# Patient Record
Sex: Male | Born: 1955 | Race: White | Hispanic: No | Marital: Married | Smoking: Never smoker
Health system: Southern US, Community
[De-identification: ages and names within clinical notes are randomized; demographics above are authoritative.]

## PROBLEM LIST (undated history)

## (undated) DIAGNOSIS — T7840XA Allergy, unspecified, initial encounter: Secondary | ICD-10-CM

## (undated) DIAGNOSIS — I4891 Unspecified atrial fibrillation: Secondary | ICD-10-CM

## (undated) DIAGNOSIS — G4730 Sleep apnea, unspecified: Secondary | ICD-10-CM

## (undated) DIAGNOSIS — J309 Allergic rhinitis, unspecified: Secondary | ICD-10-CM

## (undated) DIAGNOSIS — R3915 Urgency of urination: Secondary | ICD-10-CM

## (undated) DIAGNOSIS — I10 Essential (primary) hypertension: Secondary | ICD-10-CM

## (undated) DIAGNOSIS — R319 Hematuria, unspecified: Secondary | ICD-10-CM

## (undated) DIAGNOSIS — N3281 Overactive bladder: Secondary | ICD-10-CM

## (undated) DIAGNOSIS — R7989 Other specified abnormal findings of blood chemistry: Secondary | ICD-10-CM

## (undated) DIAGNOSIS — N400 Enlarged prostate without lower urinary tract symptoms: Secondary | ICD-10-CM

## (undated) HISTORY — DX: Hematuria, unspecified: R31.9

## (undated) HISTORY — DX: Allergy, unspecified, initial encounter: T78.40XA

## (undated) HISTORY — DX: Benign prostatic hyperplasia without lower urinary tract symptoms: N40.0

## (undated) HISTORY — DX: Essential (primary) hypertension: I10

## (undated) HISTORY — DX: Morbid (severe) obesity due to excess calories: E66.01

## (undated) HISTORY — DX: Sleep apnea, unspecified: G47.30

---

## 2010-02-18 ENCOUNTER — Ambulatory Visit: Payer: Self-pay | Admitting: Family Medicine

## 2011-01-15 IMAGING — US US EXTREM LOW VENOUS*R*
1 series · 17 of 22 positions shown · non-contrast
Comparison: none

REASON FOR EXAM: STAT CR [id number]  RT leg pain  eval DVT
COMMENTS:

[Series 1: us extrem low venous*right* · 17 of 22 slices shown]
[im 1/22]
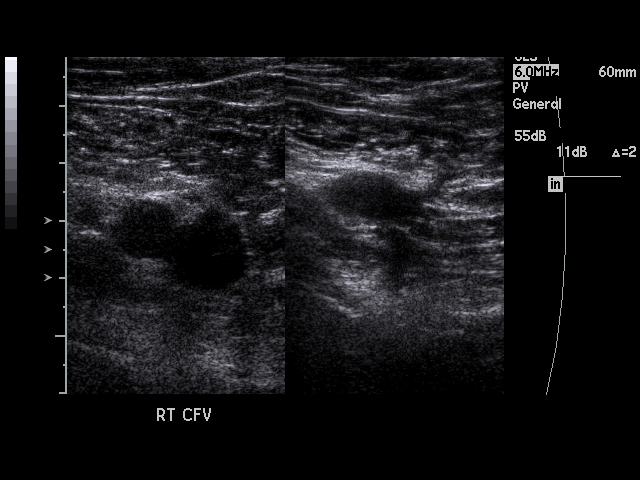
[im 2/22]
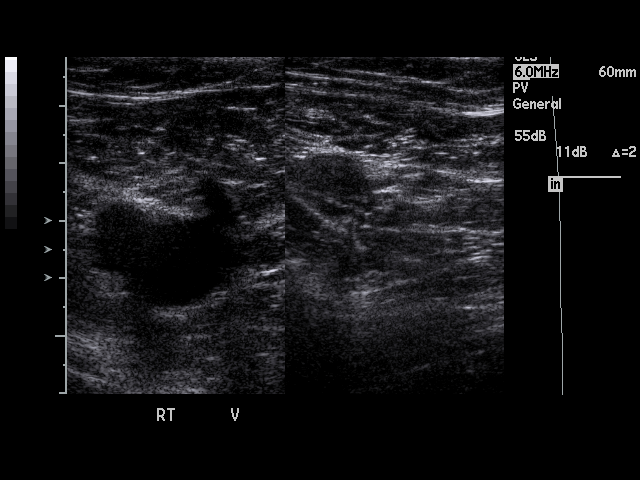
[im 4/22]
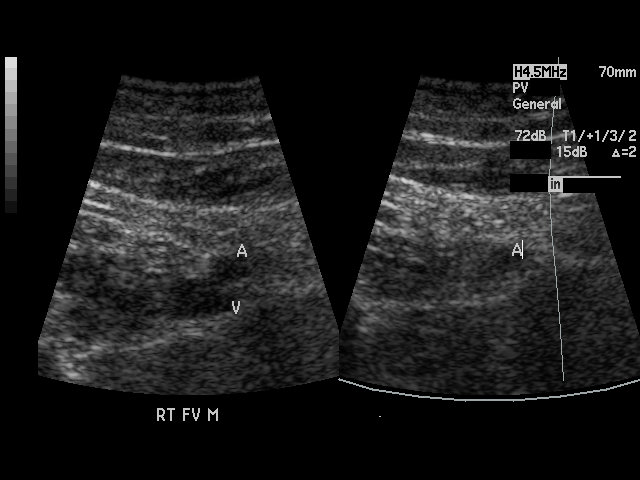
[im 5/22]
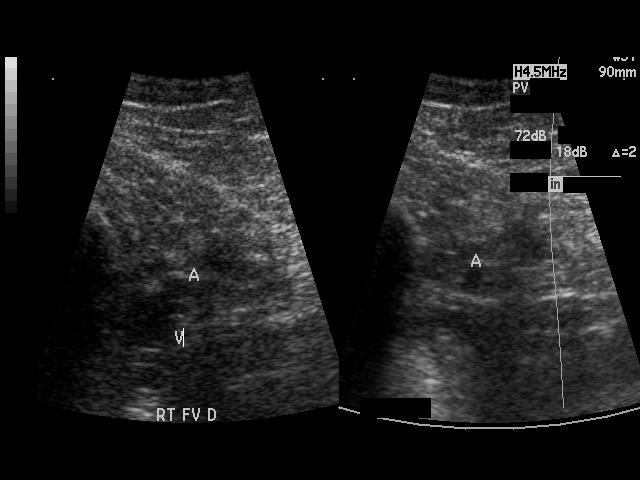
[im 6/22]
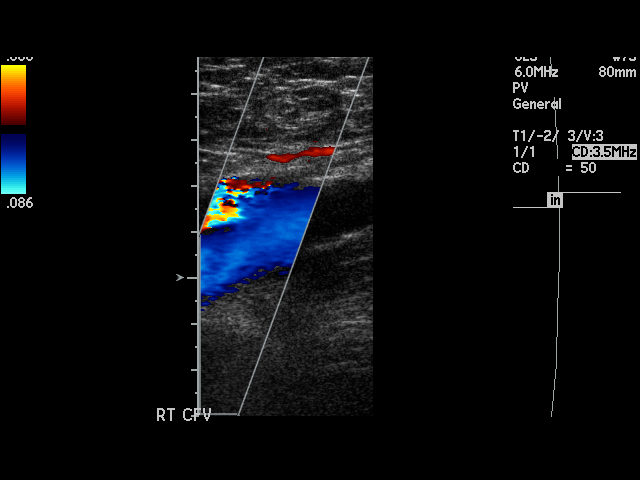
[im 8/22]
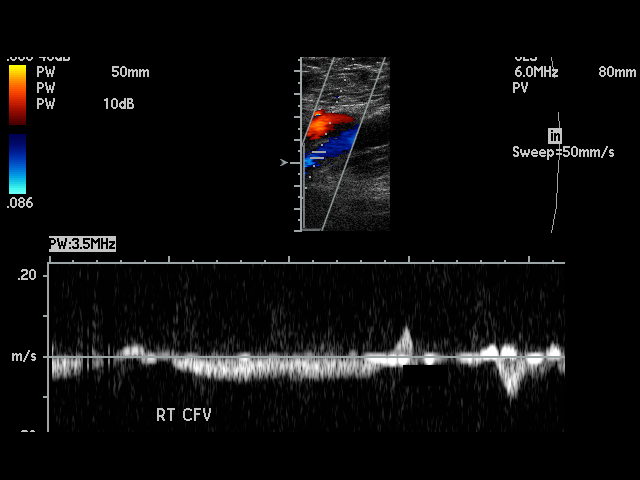
[im 9/22]
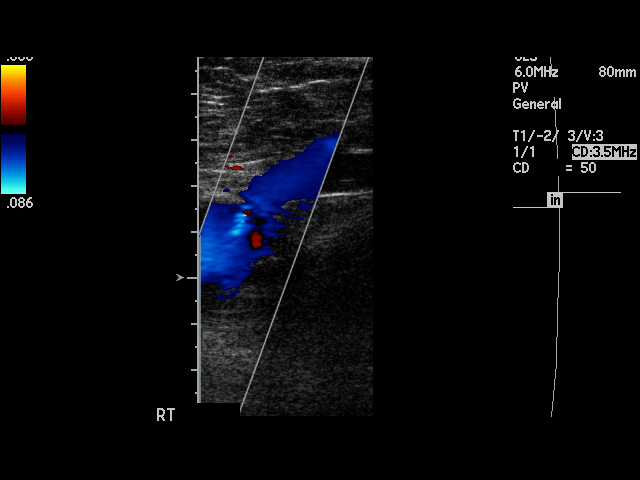
[im 10/22]
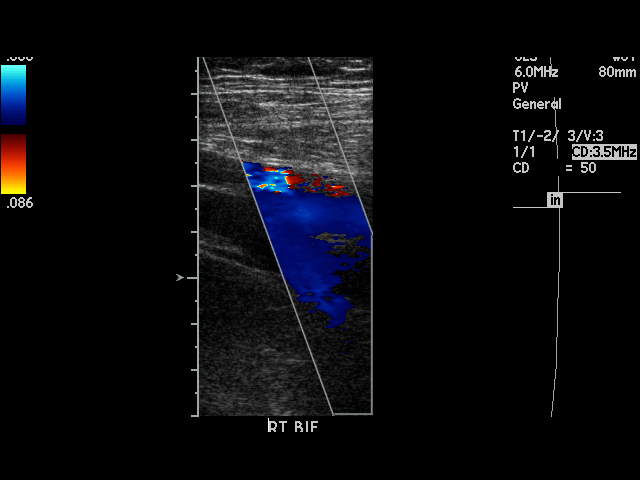
[im 12/22]
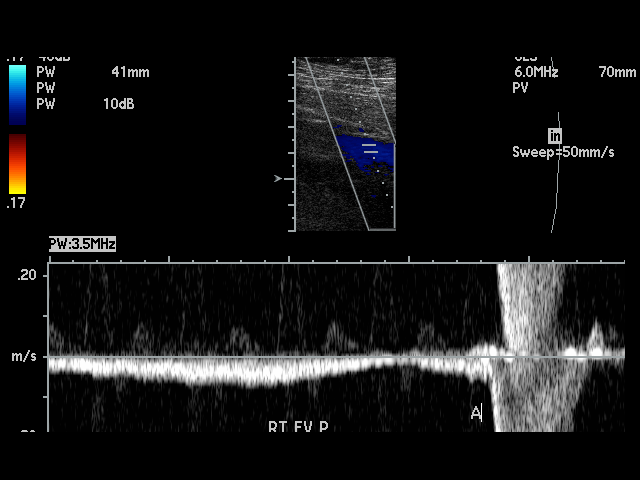
[im 13/22]
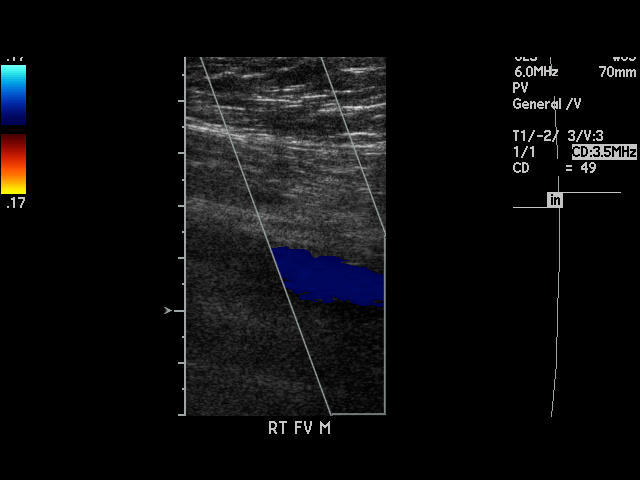
[im 14/22]
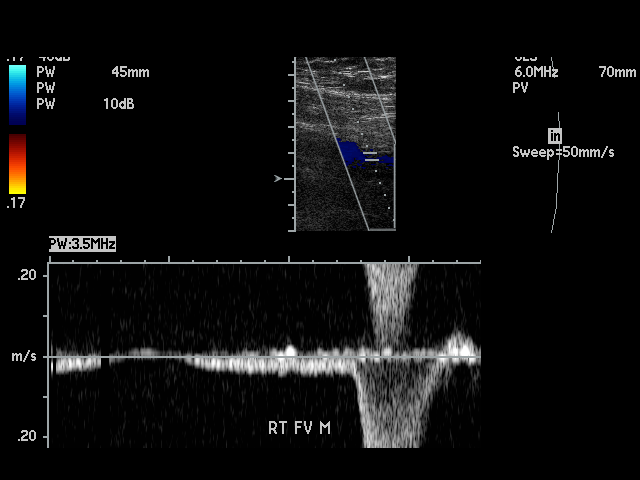
[im 15/22]
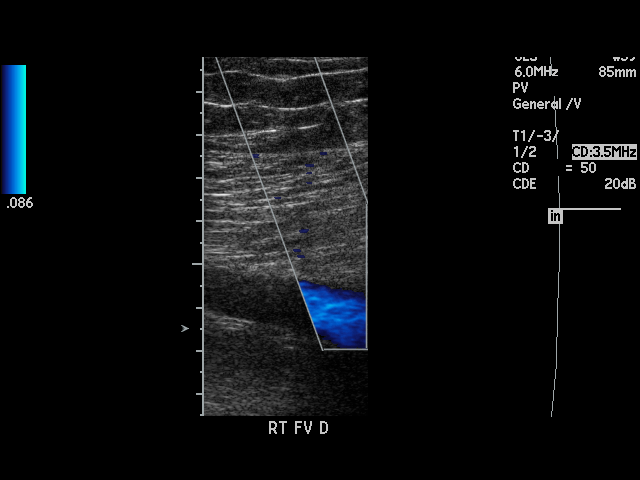
[im 17/22]
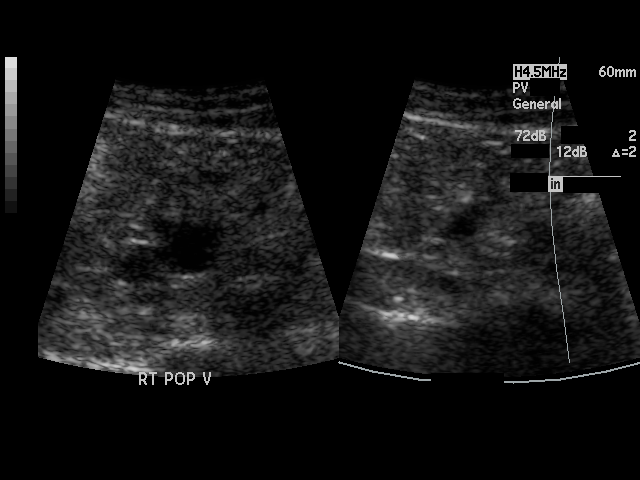
[im 18/22]
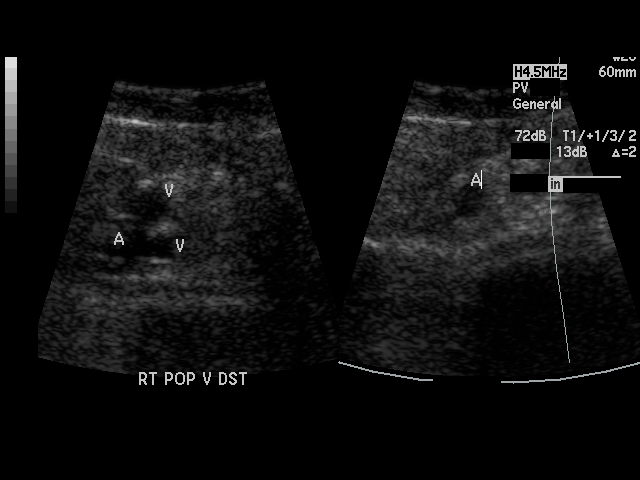
[im 19/22]
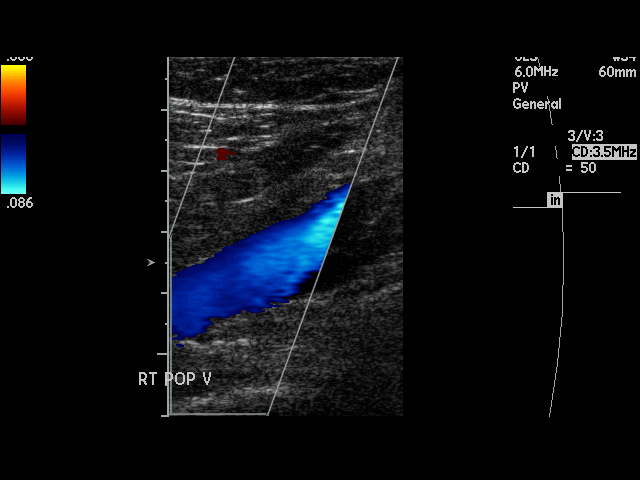
[im 21/22]
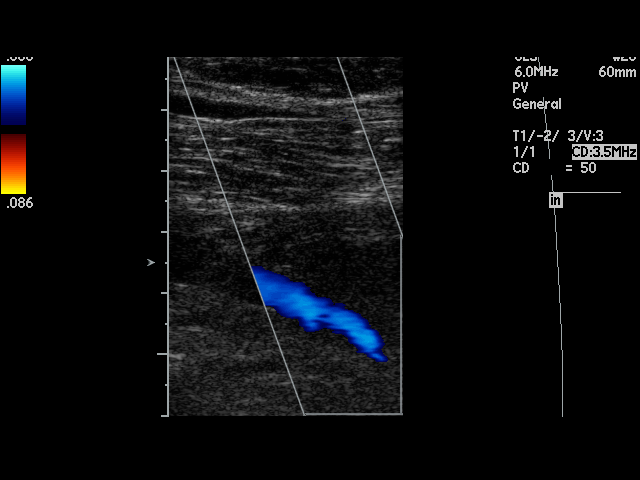
[im 22/22]
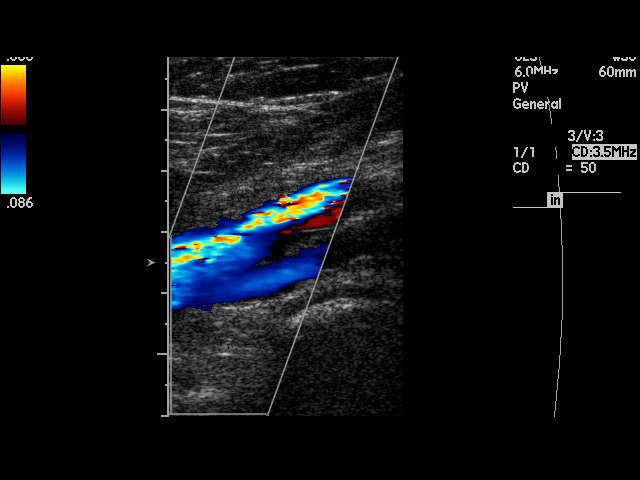

[17 of 22 positions shown; findings below may reference images not displayed]

PROCEDURE:     US  - US DOPPLER LOW EXTR RIGHT  - February 18, 2010 [DATE]

RESULT:     Comparison: None.

Technique and findings: Multiple grayscale and color Doppler images were
obtained of the right lower extremity. The common femoral, femoral, and
popliteal veins are compressible. There is no evidence of deep venous
thrombosis. There is normal augmentation.
IMPRESSION: No evidence of deep venous thrombosis in the right lower extremity.

## 2011-08-03 ENCOUNTER — Ambulatory Visit: Payer: Self-pay | Admitting: Gastroenterology

## 2014-11-27 DIAGNOSIS — Z9189 Other specified personal risk factors, not elsewhere classified: Secondary | ICD-10-CM | POA: Insufficient documentation

## 2014-11-27 DIAGNOSIS — E349 Endocrine disorder, unspecified: Secondary | ICD-10-CM | POA: Insufficient documentation

## 2015-01-05 ENCOUNTER — Ambulatory Visit (INDEPENDENT_AMBULATORY_CARE_PROVIDER_SITE_OTHER): Payer: Federal, State, Local not specified - PPO | Admitting: Urology

## 2015-01-05 VITALS — BP 144/81 | HR 76 | Ht 69.0 in | Wt 351.2 lb

## 2015-01-05 DIAGNOSIS — R3915 Urgency of urination: Secondary | ICD-10-CM | POA: Insufficient documentation

## 2015-01-05 DIAGNOSIS — I10 Essential (primary) hypertension: Secondary | ICD-10-CM | POA: Insufficient documentation

## 2015-01-05 DIAGNOSIS — E669 Obesity, unspecified: Secondary | ICD-10-CM | POA: Insufficient documentation

## 2015-01-05 DIAGNOSIS — N400 Enlarged prostate without lower urinary tract symptoms: Secondary | ICD-10-CM

## 2015-01-05 DIAGNOSIS — J309 Allergic rhinitis, unspecified: Secondary | ICD-10-CM | POA: Insufficient documentation

## 2015-01-05 LAB — MICROSCOPIC EXAMINATION: BACTERIA UA: NONE SEEN

## 2015-01-05 LAB — URINALYSIS, COMPLETE
Bilirubin, UA: NEGATIVE
Glucose, UA: NEGATIVE
Ketones, UA: NEGATIVE
Nitrite, UA: NEGATIVE
PH UA: 5.5 (ref 5.0–7.5)
Protein, UA: NEGATIVE
Specific Gravity, UA: 1.02 (ref 1.005–1.030)
UUROB: 0.2 mg/dL (ref 0.2–1.0)

## 2015-01-05 LAB — BLADDER SCAN AMB NON-IMAGING

## 2015-01-05 NOTE — Progress Notes (Addendum)
Consult for urinary symptoms referred by Dr. Kary Kos  H&P  Chief Complaint: Urinary urgency, I been told there was "blood in my urine".  History of Present Illness: 59 year old white male with a several year history of bothersome urinary urgency and frequency. A soda intake makes his symptoms worse. He tried tamsulosin in the past for his symptoms with no change. He has a slow stream but he feels like he empties his bladder. What bothers him the most is when he gets to the bathroom who have sudden urgency and might start leaking some urine. He has occasional frequency. He denies prior urologic surgery. There is no neurogenic risk. His AUA symptom score is 11 with quality of life 3. Symptoms of decreased force of stream and urgency most prevalent. Patient also has nocturia 1. He recently started CPAP. He has morbid obesity. No family history of prostate cancer.  He has had 2 urinalysis with blood on the dip but negative microscopy. Microscopy was negative at his primary care physician's office and negative here. There is no microscopic hematuria. Patient has had no gross hematuria or dysuria. He's had no flank pain or stone passage.  UA today clear  PVR 12 mL   Past Medical History  Diagnosis Date  . BPH (benign prostatic hyperplasia)   . Hematuria   . Morbid obesity   . Hypertension   . Allergy   . Sleep apnea    History reviewed. No pertinent past surgical history.  Home Medications:   (Not in a hospital admission) Allergies: No Known Allergies  Family History  Problem Relation Age of Onset  . Hypertension Mother   . Heart attack Mother   . Diabetes Mother    Social History:  reports that he has never smoked. He does not have any smokeless tobacco history on file. He reports that he drinks about 0.6 oz of alcohol per week. He reports that he does not use illicit drugs.  ROS: A complete review of systems was performed.  All systems are negative except for pertinent findings  as noted. Review of Systems  Cardiovascular: Positive for leg swelling.  Gastrointestinal: Positive for blood in stool.  All other systems reviewed and are negative.    Physical Exam:  Vital signs in last 24 hours: @VSRANGES @ General:  Alert and oriented, No acute distress HEENT: Normocephalic, atraumatic Neck: No JVD or lymphadenopathy Cardiovascular: Regular rate and rhythm Lungs: Regular rate and effort Abdomen: Soft, nontender, nondistended, no abdominal masses Back: No CVA tenderness Extremities: No edema Neurologic: Grossly intact GU: Penis uncircumcised without mass or lesion. No phimosis. Testicles descended bilaterally and palpably normal. No palpable inguinal hernias. On digital rectal exam the prostate is small and benign.  Laboratory Data:  No results found for this or any previous visit (from the past 24 hour(s)). No results found for this or any previous visit (from the past 240 hour(s)). Creatinine: No results for input(s): CREATININE in the last 168 hours.  Impression/Assessment/plan: Urinary urgency, frequency and weak stream-patient most bothered by urgency. Prostate small and benign on exam. we discussed behavioral changes such as weight loss and increased water intake been shown to improve urinary symptoms. We also discussed trial of overactive bladder medication as he had seen a commercial on TV and was interested. We discussed risk of dry mouth and constipation and gave him some samples of Vesicare. We did discuss PSA screening for prostate cancer and a PSA was sent. He'll let us know how he is doing and we can  send a prescription in for Vesicare and will be happy to see him back anytime as needed. Patient has no worrisome symptoms such as gross hematuria or dysuria. His urinalyses have been negative for microscopic hematuria therefore I do not think he needs cystoscopy or imaging at this point, but may in the future.  Burhanuddin Kohlmann 01/05/2015, 10:50 AM    Cc: Dr. Kary Kos

## 2015-01-05 NOTE — Patient Instructions (Signed)
Overactive Bladder The bladder has two functions that are totally opposite of the other. One is to relax and stretch out so it can store urine (fills like a balloon), and the other is to contract and squeeze down so that it can empty the urine that it has stored. Proper functioning of the bladder is a complex mixing of these two functions. The filling and emptying of the bladder can be influenced by:  The bladder.  The spinal cord.  The brain.  The nerves going to the bladder.  Other organs that are closely related to the bladder such as prostate in males and the vagina in females. As your bladder fills with urine, nerve signals are sent from the bladder to the brain to tell you that you may need to urinate. Normal urination requires that the bladder squeeze down with sufficient strength to empty the bladder, but this also requires that the bladder squeeze down sufficiently long to finish the job. In addition the sphincter muscles, which normally keep you from leaking urine, must also relax so that the urine can pass. Coordination between the bladder muscle squeezing down and the sphincter muscles relaxing is required to make everything happen normally. With an overactive bladder sometimes the muscles of the bladder contract unexpectedly and involuntarily and this causes an urgent need to urinate. The normal response is to try to hold urine in by contracting the sphincter muscles. Sometimes the bladder contracts so strongly that the sphincter muscles cannot stop the urine from passing out and incontinence occurs. This kind of incontinence is called urge incontinence. Having an overactive bladder can be embarrassing and awkward. It can keep you from living life the way you want to. Many people think it is just something you have to put up with as you grow older or have certain health conditions. In fact, there are treatments that can help make your life easier and more pleasant. CAUSES  Many things  can cause an overactive bladder. Possibilities include:  Urinary tract infection or infection of nearby tissues such as the prostate.  Prostate enlargement.  In women, multiple pregnancies or surgery on the uterus or urethra.  Bladder stones, inflammation, or tumors.  Caffeine.  Alcohol.  Medications. For example, diuretics (drugs that help the body get rid of extra fluid) increase urine production. Some other medicines must be taken with lots of fluids.  Muscle or nerve weakness. This might be the result of a spinal cord injury, a stroke, multiple sclerosis, or Parkinson disease.  Diabetes can cause a high urine volume which fills the bladder so quickly that the normal urge to urinate is triggered very strongly. SYMPTOMS   Loss of bladder control. You feel the need to urinate and cannot make your body wait.  Sudden, strong urges to urinate.  Urinating 8 or more times a day.  Waking up to urinate two or more times a night. DIAGNOSIS  To decide if you have overactive bladder, your health care provider will probably:  Ask about symptoms you have noticed.  Ask about your overall health. This will include questions about any medications you are taking.  Do a physical examination. This will help determine if there are obvious blockages or other problems.  Order some tests. These might include:  A blood test to check for diabetes or other health issues that could be contributing to the problem.  Urine testing. This could measure the flow of urine and the pressure on the bladder.  A test of your neurological   system (the brain, spinal cord, and nerves). This is the system that senses the need to urinate. Some of these tests are called flow tests, bladder pressure tests, and electrical measurements of the sphincter muscle.  A bladder test to check whether it is emptying completely when you urinate.  Cystoscopy. This test uses a thin tube with a tiny camera on it. It offers a  look inside your urethra and bladder to see if there are problems.  Imaging tests. You might be given a contrast dye and then asked to urinate. X-rays are taken to see how your bladder is working. TREATMENT  An overactive bladder can be treated in many ways. The treatment will depend on the cause. Whether you have a mild or severe case also makes a difference. Often, treatment can be given in your health care provider's office or clinic. Be sure to discuss the different options with your caregiver. They include:  Behavioral treatments. These do not involve medication or surgery:  Bladder training. For this, you would follow a schedule to urinate at regular intervals. This helps you learn to control the urge to urinate. At first, you might be asked to wait a few minutes after feeling the urge. In time, you should be able to schedule bathroom visits an hour or more apart.  Kegel exercises. These exercises strengthen the pelvic floor muscles, which support the bladder. Toning these muscles can help control urination even if the bladder muscles are overactive. A specialist will teach you how to do these exercises correctly. They will require daily practice.  Weight loss. If you are obese or overweight, losing weight might stop your bladder from being overactive. Talk to your health care provider about how many pounds you should lose. Also ask if there is a specific program or method that would work best for you.  Diet change. This might be suggested if constipation is making your overactive bladder worse. Your health care provider or a nutritionist can explain ways to change what you eat to ease constipation. Other people might need to take in less caffeine or alcohol. Sometimes drinking fewer fluids is needed, too.  Protection. This is not an actual treatment. But, you could wear special pads to take care of any leakage while you wait for other treatments to take effect. This will help you avoid  embarrassment.  Physical treatments.  Electrical stimulation. Electrodes will send gentle pulses to the nerves or muscles that help control the bladder. The goal is to strengthen them. Sometimes this is done with the electrodes outside the body. Or, they might be placed inside the body (implanted). This treatment can take several months to have an effect.  Medications. These are usually used along with other treatments. Several medicines are available. Some are injected into the muscles involved in urination. Others come in pill form. Medications sometimes prescribed include:  Anticholinergics. These drugs block the signals that the nerves deliver to the bladder. This keeps it from releasing urine at the wrong time. Researchers think the drugs might help in other ways, too.  Imipramine. This is an antidepressant. But, it relaxes bladder muscles.  Botox. This is still experimental. Some people believe that injecting it into the bladder muscles will relax them so they work more normally. It has also been injected into the sphincter muscle when the sphincter muscle does not open properly. This is a temporary fix, however. Also, it might make matters worse, especially in older people.  Surgery.  A device might be implanted   to help manage your nerves. It works on the nerves that signal when you need to urinate.  Surgery is sometimes needed with electrical stimulation. If the electrodes are implanted, this is done through surgery.  Sometimes repairs need to be made through surgery. For example, the size of the bladder can be changed. This is usually done in severe cases only. HOME CARE INSTRUCTIONS   Take any medications your health care provider prescribed or suggested. Follow the directions carefully.  Practice any lifestyle changes that are recommended. These might include:  Drinking less fluid or drinking at different times of the day. If you need to urinate often during the night, for  example, you may need to stop drinking fluids early in the evening.  Cutting down on caffeine or alcohol. They can both make an overactive bladder worse. Caffeine is found in coffee, tea, and sodas.  Doing Kegel exercises to strengthen muscles.  Losing weight, if that is recommended.  Eating a healthy and balanced diet. This will help you avoid constipation.  Keep a journal or a log. You might be asked to record how much you drink and when, and also when you feel the need to urinate.  Learn how to care for implants or other devices, such as pessaries. SEEK MEDICAL CARE IF:   Your overactive bladder gets worse.  You feel increased pain or irritation when you urinate.  You notice blood in your urine.  You have questions about any medications or devices that your health care provider recommended.  You notice blood, pus, or swelling at the site of any test or treatment procedure.  You have an oral temperature above 102F (38.9C). SEEK IMMEDIATE MEDICAL CARE IF:  You have an oral temperature above 102F (38.9C), not controlled by medicine. Document Released: 05/07/2009 Document Revised: 11/25/2013 Document Reviewed: 05/07/2009 [employer] Patient Information 2015 [employer]. This information is not intended to replace advice given to you by your health care provider. Make sure you discuss any questions you have with your health care provider.  

## 2015-01-06 LAB — PSA TOTAL (REFLEX TO FREE): Prostate Specific Ag, Serum: 0.9 ng/mL (ref 0.0–4.0)

## 2015-01-12 ENCOUNTER — Telehealth: Payer: Self-pay

## 2015-01-12 ENCOUNTER — Encounter: Payer: Self-pay | Admitting: Urology

## 2015-01-12 NOTE — Telephone Encounter (Signed)
Spoke with wife, Marcie Bal, made aware of PSA being normal. Cw,lpn

## 2015-01-12 NOTE — Telephone Encounter (Signed)
-----   Message from Festus Aloe, MD sent at 01/12/2015  1:48 PM EDT ----- Notify patient PSA was normal.

## 2015-02-02 ENCOUNTER — Telehealth: Payer: Self-pay | Admitting: Urology

## 2015-02-02 DIAGNOSIS — R3915 Urgency of urination: Secondary | ICD-10-CM

## 2015-02-03 MED ORDER — SOLIFENACIN SUCCINATE 5 MG PO TABS: 5.0000 mg | ORAL_TABLET | Freq: Every day | ORAL | Status: DC

## 2015-02-03 NOTE — Telephone Encounter (Signed)
Pt came in the office today to pick up samples. He was told by [facility] that he could come by and pick up samples,because she had to wait to get permission from Dr. Junious Silk to send the prescription to his pharmacy. I notice that Dr. Junious Silk documented in the pt last office note that if the samples was working then the pt could call us and we would send the prescription to pharmacy. The prescription was sent to the pt pharmacy.

## 2021-05-19 ENCOUNTER — Other Ambulatory Visit: Payer: Self-pay

## 2021-05-19 ENCOUNTER — Ambulatory Visit
Admission: EM | Admit: 2021-05-19 | Discharge: 2021-05-19 | Disposition: A | Discharge status: Home / Self Care | Payer: Federal, State, Local not specified - PPO

## 2021-05-19 ENCOUNTER — Encounter: Payer: Self-pay | Admitting: Emergency Medicine

## 2021-05-19 DIAGNOSIS — H6123 Impacted cerumen, bilateral: Secondary | ICD-10-CM | POA: Diagnosis not present

## 2021-05-19 NOTE — ED Triage Notes (Addendum)
Pt here with slight congestion and right ear tinnitus x 2 days.

## 2021-05-19 NOTE — ED Provider Notes (Signed)
Christopher Daugherty    CSN: [id number] Arrival date & time: 05/19/21  1109      History   Chief Complaint Chief Complaint  Patient presents with   Nasal Congestion   Otalgia    HPI Christopher Daugherty is a 65 y.o. male.  Patient presents with ringing in his ears x2 days.  He reports mild congestion but no fever, chills, ear pain, ear drainage, sore throat, cough, shortness of breath, or other symptoms.  No treatments at home.  He reports similar ear ringing with previous episodes of cerumen impaction.  His medical history includes hypertension, morbid obesity, allergic rhinitis, sleep apnea, BPH.  The history is provided by the patient and medical records.   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 No pertinent surgical history.     Home Medications    Prior to Admission medications   Medication Sig Start Date End Date Taking? Authorizing Provider  losartan (COZAAR) 50 MG tablet Take 50 mg by mouth daily.    [provider]  solifenacin (VESICARE) 5 MG tablet Take 1 tablet (5 mg total) by mouth daily. 02/03/15   Festus Aloe, MD  Testosterone Propionate (FIRST-TESTOSTERONE [facility]) 2 % CREA Place 1.5 mLs onto the skin daily. 12/04/14   [provider]    Family History Family History  Problem Relation Age of Onset   Hypertension Mother    Heart attack Mother    Diabetes Mother     Social History Social History   Tobacco Use   Smoking status: Never   Smokeless tobacco: Never  Substance Use Topics   Alcohol use: Yes    Alcohol/week: 1.0 standard drink     Types: 1 Standard drinks or equivalent per week    Comment: occasional   Drug use: No     Allergies   Patient has no known allergies.   Review of Systems Review of Systems  Constitutional:  Negative for chills and fever.  HENT:  Positive for congestion. Negative for ear discharge, ear pain and sore throat.        Ringing in ears.  Respiratory:  Negative for cough and shortness of breath.   Cardiovascular:  Negative for chest pain and palpitations.  All other systems reviewed and are negative.   Physical Exam Triage Vital Signs ED Triage Vitals  Enc Vitals Group     BP      Pulse      Resp      Temp      Temp src      SpO2      Weight      Height      Head Circumference      Peak Flow      Pain Score      Pain Loc      Pain Edu?      Excl. in [place]?    No data found.  Updated Vital Signs BP 122/76 (BP Location: Left Arm)   Pulse 94   Temp 98.2 F (36.8 C) (  Oral)   Resp 18   SpO2 96%   Visual Acuity Right Eye Distance:   Left Eye Distance:   Bilateral Distance:    Right Eye Near:   Left Eye Near:    Bilateral Near:     Physical Exam Vitals and nursing note reviewed.  Constitutional:      General: He is not in acute distress.    Appearance: He is well-developed. He is obese.  HENT:     Head: Normocephalic and atraumatic.     Right Ear: There is impacted cerumen.     Left Ear: There is impacted cerumen.     Nose: Nose normal.     Mouth/Throat:     Mouth: Mucous membranes are moist.     Pharynx: Oropharynx is clear.  Eyes:     Conjunctiva/sclera: Conjunctivae normal.  Cardiovascular:     Rate and Rhythm: Normal rate and regular rhythm.     Heart sounds: Normal heart sounds.  Pulmonary:     Effort: Pulmonary effort is normal. No respiratory distress.     Breath sounds: Normal breath sounds.  Abdominal:     Palpations: Abdomen is soft.     Tenderness: There is no abdominal tenderness.  Musculoskeletal:     Cervical back: Neck supple.  Skin:     General: Skin is warm and dry.  Neurological:     General: No focal deficit present.     Mental Status: He is alert and oriented to person, place, and time.  Psychiatric:        Mood and Affect: Mood normal.        Behavior: Behavior normal.     UC Treatments / Results  Labs (all labs ordered are listed, but only abnormal results are displayed) Labs Reviewed - No data to display  EKG   Radiology No results found.  Procedures Procedures (including critical care time)  Medications Ordered in UC Medications - No data to display  Initial Impression / Assessment and Plan / UC Course  I have reviewed the triage vital signs and the nursing notes.  Pertinent labs & imaging results that were available during my care of the patient were reviewed by me and considered in my medical decision making (see chart for details).   Bilateral impacted cerumen.  Cerumen removed via irrigation by RN.  Patient reports relief of his symptoms after cerumen removal.  TMs noted to be clear.  Education provided on earwax buildup.  Instructed patient to follow-up with his PCP as needed.  He agrees to plan of care.   Final Clinical Impressions(s) / UC Diagnoses   Final diagnoses:  Impacted cerumen, bilateral   Discharge Instructions   None    ED Prescriptions   None    PDMP not reviewed this encounter.   Sharion Balloon, NP 05/19/21 1150

## 2022-05-12 ENCOUNTER — Encounter: Payer: Self-pay | Admitting: *Deleted

## 2022-05-13 ENCOUNTER — Encounter: Payer: Self-pay | Admitting: *Deleted

## 2022-05-13 ENCOUNTER — Ambulatory Visit: Payer: Medicare Other | Admitting: Certified Registered Nurse Anesthetist

## 2022-05-13 ENCOUNTER — Encounter
Admission: RE | Disposition: A | Discharge status: Home / Self Care | Payer: Self-pay | Source: Home / Self Care | Attending: Gastroenterology

## 2022-05-13 ENCOUNTER — Ambulatory Visit
Admission: RE | Admit: 2022-05-13 | Discharge: 2022-05-13 | Disposition: A | Discharge status: Home / Self Care | Payer: Medicare Other | Attending: Gastroenterology | Admitting: Gastroenterology

## 2022-05-13 DIAGNOSIS — I10 Essential (primary) hypertension: Secondary | ICD-10-CM | POA: Insufficient documentation

## 2022-05-13 DIAGNOSIS — Z6841 Body Mass Index (BMI) 40.0 and over, adult: Secondary | ICD-10-CM | POA: Diagnosis not present

## 2022-05-13 DIAGNOSIS — K5730 Diverticulosis of large intestine without perforation or abscess without bleeding: Secondary | ICD-10-CM | POA: Insufficient documentation

## 2022-05-13 DIAGNOSIS — K640 First degree hemorrhoids: Secondary | ICD-10-CM | POA: Insufficient documentation

## 2022-05-13 DIAGNOSIS — K5520 Angiodysplasia of colon without hemorrhage: Secondary | ICD-10-CM | POA: Diagnosis not present

## 2022-05-13 DIAGNOSIS — Z1211 Encounter for screening for malignant neoplasm of colon: Secondary | ICD-10-CM | POA: Insufficient documentation

## 2022-05-13 DIAGNOSIS — D125 Benign neoplasm of sigmoid colon: Secondary | ICD-10-CM | POA: Diagnosis not present

## 2022-05-13 HISTORY — PX: COLONOSCOPY WITH PROPOFOL: SHX5780

## 2022-05-13 SURGERY — COLONOSCOPY WITH PROPOFOL
Anesthesia: General

## 2022-05-13 MED ORDER — PROPOFOL 500 MG/50ML IV EMUL
INTRAVENOUS | Status: DC | PRN
  Administered 2022-05-13: 150 ug/kg/min via INTRAVENOUS

## 2022-05-13 MED ORDER — PROPOFOL 10 MG/ML IV BOLUS
INTRAVENOUS | Status: DC | PRN
  Administered 2022-05-13 (×2): 30 mg via INTRAVENOUS
  Administered 2022-05-13: 70 mg via INTRAVENOUS

## 2022-05-13 MED ORDER — SODIUM CHLORIDE 0.9 % IV SOLN: INTRAVENOUS | Status: DC

## 2022-05-13 MED ORDER — LIDOCAINE HCL (CARDIAC) PF 100 MG/5ML IV SOSY
PREFILLED_SYRINGE | INTRAVENOUS | Status: DC | PRN
  Administered 2022-05-13: 50 mg via INTRAVENOUS

## 2022-05-13 NOTE — Op Note (Signed)
Christopher Daugherty Patient Name: Christopher Daugherty Procedure Date: 05/13/2022 10:06 AM MRN: [id number] Account #: [id number] Date of Birth: 1955/09/04 Admit Type: Outpatient Age: 66 Room: [facility] ENDO ROOM 3 Gender: Male Note Status: Finalized Instrument Name: Christopher Daugherty [id number] Procedure:             Colonoscopy Indications:           Screening for colorectal malignant neoplasm Providers:             Andrey Farmer MD, MD Referring MD:          No Local Md, MD (Referring MD) Medicines:             Monitored Anesthesia Care Complications:         No immediate complications. Estimated blood loss:                         Minimal. Procedure:             Pre-Anesthesia Assessment:                        - Prior to the procedure, a History and Physical was                         performed, and patient medications and allergies were                         reviewed. The patient is competent. The risks and                         benefits of the procedure and the sedation options and                         risks were discussed with the patient. All questions                         were answered and informed consent was obtained.                         Patient identification and proposed procedure were                         verified by the physician, the nurse, the                         anesthesiologist, the anesthetist and the technician                         in the endoscopy suite. Mental Status Examination:                         alert and oriented. Airway Examination: normal                         oropharyngeal airway and neck mobility. Respiratory                         Examination: clear to auscultation. CV Examination:  normal. Prophylactic Antibiotics: The patient does not                         require prophylactic antibiotics. Prior                         Anticoagulants: The patient has taken no previous                          anticoagulant or antiplatelet agents. ASA Grade                         Assessment: III - A patient with severe systemic                         disease. After reviewing the risks and benefits, the                         patient was deemed in satisfactory condition to                         undergo the procedure. The anesthesia plan was to use                         monitored anesthesia care (MAC). Immediately prior to                         administration of medications, the patient was                         re-assessed for adequacy to receive sedatives. The                         heart rate, respiratory rate, oxygen saturations,                         blood pressure, adequacy of pulmonary ventilation, and                         response to care were monitored throughout the                         procedure. The physical status of the patient was                         re-assessed after the procedure.                        After obtaining informed consent, the colonoscope was                         passed under direct vision. Throughout the procedure,                         the patient's blood pressure, pulse, and oxygen                         saturations were monitored continuously. The  Colonoscope was introduced through the anus and                         advanced to the the cecum, identified by appendiceal                         orifice and ileocecal valve. The colonoscopy was                         performed without difficulty. The patient tolerated                         the procedure well. The quality of the bowel                         preparation was good. Findings:      The perianal and digital rectal examinations were normal.      A single medium-sized localized angiodysplastic lesion without bleeding       was found in the cecum.      A 11 mm polyp was found in the cecum. The polyp was Paris classification       Is  (protruding, sessile). The polyp was removed with a piecemeal       technique using a cold snare. Resection and retrieval were complete.       Estimated blood loss was minimal. To prevent bleeding post-intervention,       two hemostatic clips were successfully placed. There was no bleeding       during, or at the end, of the procedure.      A 4 mm polyp was found in the sigmoid colon. The polyp was sessile. The       polyp was removed with a cold snare. Resection and retrieval were       complete. Estimated blood loss was minimal.      Multiple small and large-mouthed diverticula were found in the sigmoid       colon and descending colon.      Internal hemorrhoids were found during retroflexion. The hemorrhoids       were Grade I (internal hemorrhoids that do not prolapse).      The exam was otherwise without abnormality on direct and retroflexion       views. Impression:            - A single non-bleeding colonic angiodysplastic lesion.                        - One 11 mm polyp in the cecum, removed piecemeal                         using a cold snare. Resected and retrieved. Clips were                         placed.                        - One 4 mm polyp in the sigmoid colon, removed with a                         cold snare. Resected and retrieved.                        -  Diverticulosis in the sigmoid colon and in the                         descending colon.                        - Internal hemorrhoids.                        - The examination was otherwise normal on direct and                         retroflexion views. Recommendation:        - Discharge patient to home.                        - Resume previous diet.                        - Continue present medications.                        - Await pathology results.                        - Repeat colonoscopy in 6 months for surveillance                         after piecemeal polypectomy.                        - Return to  referring physician as previously                         scheduled. Procedure Code(s):     --- Professional ---                        [phone], Colonoscopy, flexible; with removal of                         tumor(s), polyp(s), or other lesion(s) by snare                         technique Diagnosis Code(s):     --- Professional ---                        Z12.11, Encounter for screening for malignant neoplasm                         of colon                        K55.20, Angiodysplasia of colon without hemorrhage                        K63.5, Polyp of colon                        K64.0, First degree hemorrhoids                        K57.30, Diverticulosis of large intestine without  perforation or abscess without bleeding CPT copyright 2019 American [employer] Association. All rights reserved. The codes documented in this report are preliminary and upon coder review may  be revised to meet current compliance requirements. Andrey Farmer MD, MD 05/13/2022 10:50:45 AM Number of Addenda: 0 Note Initiated On: 05/13/2022 10:06 AM Scope Withdrawal Time: 0 hours 16 minutes 47 seconds  Total Procedure Duration: 0 hours 24 minutes 17 seconds  Estimated Blood Loss:  Estimated blood loss was minimal.      [facility]

## 2022-05-13 NOTE — Transfer of Care (Signed)
Immediate Anesthesia Transfer of Care Note  Patient: Christopher Daugherty  Procedure(s) Performed: COLONOSCOPY WITH PROPOFOL  Patient Location: PACU  Anesthesia Type:General  Level of Consciousness: drowsy  Airway & Oxygen Therapy: Patient Spontanous Breathing  Post-op Assessment: Report given to RN and Post -op Vital signs reviewed and stable  Post vital signs: Reviewed and stable  Last Vitals:  Vitals Value Taken Time  BP 157/102 05/13/22 1052  Temp    Pulse 103 05/13/22 1052  Resp 18 05/13/22 1052  SpO2 96 % 05/13/22 1052    Last Pain:  Vitals:   05/13/22 0942  TempSrc: Temporal  PainSc: 0-No pain         Complications: No notable events documented.

## 2022-05-13 NOTE — Anesthesia Preprocedure Evaluation (Signed)
Anesthesia Evaluation  Patient identified by MRN, date of birth, ID band Patient awake    Reviewed: Allergy & Precautions, NPO status , Patient's Chart, lab work & pertinent test results  History of Anesthesia Complications Negative for: history of anesthetic complications  Airway Mallampati: IV  TM Distance: >3 FB Neck ROM: full    Dental  (+) Chipped, Dental Advidsory Given   Pulmonary neg shortness of breath, sleep apnea and Continuous Positive Airway Pressure Ventilation , neg COPD,    Pulmonary exam normal        Cardiovascular hypertension, (-) angina(-) Past MI and (-) CABG negative cardio ROS Normal cardiovascular exam     Neuro/Psych negative neurological ROS  negative psych ROS   GI/Hepatic negative GI ROS, Neg liver ROS,   Endo/Other  Morbid obesity  Renal/GU negative Renal ROS  negative genitourinary   Musculoskeletal   Abdominal   Peds  Hematology negative hematology ROS (+)   Anesthesia Other Findings Past Medical History: No date: Allergy No date: BPH (benign prostatic hyperplasia) No date: Hematuria No date: Hypertension No date: Morbid obesity ([place]) No date: Sleep apnea  History reviewed. No pertinent surgical history.  BMI    Body Mass Index: 44.09 kg/m      Reproductive/Obstetrics negative OB ROS                             Anesthesia Physical Anesthesia Plan  ASA: 3  Anesthesia Plan: General   Post-op Pain Management: Minimal or no pain anticipated   Induction: Intravenous  PONV Risk Score and Plan: 3 and Propofol infusion, TIVA and Ondansetron  Airway Management Planned: Nasal Cannula  Additional Equipment: None  Intra-op Plan:   Post-operative Plan:   Informed Consent: I have reviewed the patients History and Physical, chart, labs and discussed the procedure including the risks, benefits and alternatives for the proposed anesthesia with the  patient or authorized representative who has indicated his/her understanding and acceptance.     Dental advisory given  Plan Discussed with: CRNA and Surgeon  Anesthesia Plan Comments: (Discussed risks of anesthesia with patient, including possibility of difficulty with spontaneous ventilation under anesthesia necessitating airway intervention, PONV, and rare risks such as cardiac or respiratory or neurological events, and allergic reactions. Discussed the role of CRNA in patient's perioperative care. Patient understands.)        Anesthesia Quick Evaluation

## 2022-05-13 NOTE — Anesthesia Procedure Notes (Signed)
Date/Time: 05/13/2022 10:14 AM  Performed by: Johnna Acosta, CRNAPre-anesthesia Checklist: Patient identified, Emergency Drugs available, Suction available, Patient being monitored and Timeout performed Patient Re-evaluated:Patient Re-evaluated prior to induction Oxygen Delivery Method: Nasal cannula Preoxygenation: Pre-oxygenation with 100% oxygen Induction Type: IV induction

## 2022-05-13 NOTE — H&P (Signed)
Outpatient short stay form Pre-procedure 05/13/2022  Christopher Rubenstein, MD  Primary Physician: Ellene Route  Reason for visit:  Screening  History of present illness:    66 y/o gentleman with history of obesity and hypertension here for screening colonoscopy. Last colonoscopy 10 years ago was normal. No blood thinners. No family history of GI malignancies. No significant abdominal surgeries.    Current Facility-Administered Medications:    0.9 %  sodium chloride infusion, , Intravenous, Continuous, Ebonique Hallstrom, Hilton Cork, MD, Last Rate: 20 mL/hr at 05/13/22 0954, Continued from Pre-op at 05/13/22 0954  Medications Prior to Admission  Medication Sig Dispense Refill Last Dose   losartan (COZAAR) 50 MG tablet Take 50 mg by mouth daily.   05/13/2022 at 0500   solifenacin (VESICARE) 5 MG tablet Take 1 tablet (5 mg total) by mouth daily. 30 tablet 6    Testosterone Propionate (FIRST-TESTOSTERONE MC) 2 % CREA Place 1.5 mLs onto the skin daily.        No Known Allergies   Past Medical History:  Diagnosis Date   Allergy    BPH (benign prostatic hyperplasia)    Hematuria    Hypertension    Morbid obesity ([place])    Sleep apnea     Review of systems:  Otherwise negative.    Physical Exam  Gen: Alert, oriented. Appears stated age.  HEENT:PERRLA. Lungs: No respiratory distress CV: RRR Abd: soft, benign, no masses Ext: No edema    Planned procedures: Proceed with colonoscopy. The patient understands the nature of the planned procedure, indications, risks, alternatives and potential complications including but not limited to bleeding, infection, perforation, damage to internal organs and possible oversedation/side effects from anesthesia. The patient agrees and gives consent to proceed.  Please refer to procedure notes for findings, recommendations and patient disposition/instructions.     Christopher Rubenstein, MD [facility]

## 2022-05-13 NOTE — Interval H&P Note (Signed)
History and Physical Interval Note:  05/13/2022 10:08 AM  Christopher Daugherty  has presented today for surgery, with the diagnosis of colon cancer screening.  The various methods of treatment have been discussed with the patient and family. After consideration of risks, benefits and other options for treatment, the patient has consented to  Procedure(s): COLONOSCOPY WITH PROPOFOL (N/A) as a surgical intervention.  The patient's history has been reviewed, patient examined, no change in status, stable for surgery.  I have reviewed the patient's chart and labs.  Questions were answered to the patient's satisfaction.     [name]  Ok to proceed with colonoscopy

## 2022-05-16 LAB — SURGICAL PATHOLOGY

## 2022-05-16 NOTE — Anesthesia Postprocedure Evaluation (Signed)
Anesthesia Post Note  Patient: Christopher Daugherty  Procedure(s) Performed: COLONOSCOPY WITH PROPOFOL  Patient location during evaluation: PACU Anesthesia Type: General Level of consciousness: awake and alert Pain management: pain level controlled Vital Signs Assessment: post-procedure vital signs reviewed and stable Respiratory status: spontaneous breathing, nonlabored ventilation, respiratory function stable and patient connected to nasal cannula oxygen Cardiovascular status: blood pressure returned to baseline and stable Postop Assessment: no apparent nausea or vomiting Anesthetic complications: no   No notable events documented.   Last Vitals:  Vitals:   05/13/22 1110 05/13/22 1120  BP: 126/86 (!) 149/98  Pulse: 83 81  Resp: 18 10  Temp:    SpO2: 99% 100%    Last Pain:  Vitals:   05/14/22 1017  TempSrc:   PainSc: 0-No pain                 Dimas Millin

## 2023-01-31 ENCOUNTER — Other Ambulatory Visit: Payer: Self-pay

## 2023-01-31 ENCOUNTER — Emergency Department
Admission: EM | Admit: 2023-01-31 | Discharge: 2023-01-31 | Disposition: A | Discharge status: Home / Self Care | Payer: Medicare Other | Attending: Emergency Medicine | Admitting: Emergency Medicine

## 2023-01-31 ENCOUNTER — Emergency Department: Payer: Medicare Other

## 2023-01-31 DIAGNOSIS — E876 Hypokalemia: Secondary | ICD-10-CM | POA: Insufficient documentation

## 2023-01-31 DIAGNOSIS — R6883 Chills (without fever): Secondary | ICD-10-CM | POA: Insufficient documentation

## 2023-01-31 DIAGNOSIS — Z7901 Long term (current) use of anticoagulants: Secondary | ICD-10-CM | POA: Insufficient documentation

## 2023-01-31 DIAGNOSIS — I4891 Unspecified atrial fibrillation: Secondary | ICD-10-CM | POA: Diagnosis not present

## 2023-01-31 DIAGNOSIS — R61 Generalized hyperhidrosis: Secondary | ICD-10-CM | POA: Diagnosis present

## 2023-01-31 LAB — CBC
HCT: 46.2 % (ref 39.0–52.0)
Hemoglobin: 15.3 g/dL (ref 13.0–17.0)
MCH: 31.2 pg (ref 26.0–34.0)
MCHC: 33.1 g/dL (ref 30.0–36.0)
MCV: 94.3 fL (ref 80.0–100.0)
Platelets: 221 10*3/uL (ref 150–400)
RBC: 4.9 MIL/uL (ref 4.22–5.81)
RDW: 12.7 % (ref 11.5–15.5)
WBC: 12.3 10*3/uL — ABNORMAL HIGH (ref 4.0–10.5)
nRBC: 0 % (ref 0.0–0.2)

## 2023-01-31 LAB — BASIC METABOLIC PANEL
Anion gap: 12 (ref 5–15)
BUN: 21 mg/dL (ref 8–23)
CO2: 25 mmol/L (ref 22–32)
Calcium: 8.3 mg/dL — ABNORMAL LOW (ref 8.9–10.3)
Chloride: 95 mmol/L — ABNORMAL LOW (ref 98–111)
Creatinine, Ser: 0.78 mg/dL (ref 0.61–1.24)
GFR, Estimated: >60 mL/min (ref >60)
Glucose, Bld: 102 mg/dL — ABNORMAL HIGH (ref 70–99)
Potassium: 3 mmol/L — ABNORMAL LOW (ref 3.5–5.1)
Sodium: 132 mmol/L — ABNORMAL LOW (ref 135–145)

## 2023-01-31 MED ORDER — APIXABAN 5 MG PO TABS: 5.0000 mg | ORAL_TABLET | Freq: Two times a day (BID) | ORAL | 2 refills | Status: DC

## 2023-01-31 MED ORDER — METOPROLOL TARTRATE 25 MG PO TABS: 25.0000 mg | ORAL_TABLET | Freq: Two times a day (BID) | ORAL | 2 refills | Status: AC

## 2023-01-31 MED ORDER — POTASSIUM CHLORIDE CRYS ER 20 MEQ PO TBCR
40.0000 meq | EXTENDED_RELEASE_TABLET | Freq: Once | ORAL | Status: AC
  Administered 2023-01-31: 40 meq via ORAL
  Filled 2023-01-31: qty 2

## 2023-01-31 NOTE — ED Provider Notes (Signed)
[facility] Provider Note    Event Date/Time   First MD Initiated Contact with Patient 01/31/23 1205     (approximate)   History   Atrial Fibrillation   HPI  Christopher Daugherty is a 67 y.o. male  who presents to the emergency department today because of concern for new onset atrial fibrillation. Patient went to urgent care today because of concern for some sweats and chills, however was found to be in afib. Patient denies any history of afib. Does have high blood sugar. Has not had any chest pain or palpitations.       Physical Exam   Triage Vital Signs: ED Triage Vitals  Enc Vitals Group     BP 01/31/23 0953 138/87     Pulse Rate 01/31/23 0953 (!) 102     Resp 01/31/23 0953 19     Temp 01/31/23 0953 98.9 F (37.2 C)     Temp src --      SpO2 01/31/23 0953 97 %     Weight --      Height --      Head Circumference --      Peak Flow --      Pain Score 01/31/23 0952 0     Pain Loc --      Pain Edu? --      Excl. in GC? --     Most recent vital signs: Vitals:   01/31/23 0953  BP: 138/87  Pulse: (!) 102  Resp: 19  Temp: 98.9 F (37.2 C)  SpO2: 97%   General: Awake, alert, oriented. CV:  Good peripheral perfusion. Regular rate. Irregular rhythm. Resp:  Normal effort. Lungs clear. Abd:  No distention.    ED Results / Procedures / Treatments   Labs (all labs ordered are listed, but only abnormal results are displayed) Labs Reviewed  BASIC METABOLIC PANEL - Abnormal; Notable for the following components:      Result Value   Sodium 132 (*)    Potassium 3.0 (*)    Chloride 95 (*)    Glucose, Bld 102 (*)    Calcium 8.3 (*)    All other components within normal limits  CBC - Abnormal; Notable for the following components:   WBC 12.3 (*)    All other components within normal limits     EKG  I, Phineas Semen, attending physician, personally viewed and interpreted this EKG  EKG Time: 0957 Rate: 99 Rhythm: atrial  fibrillation Axis: normal Intervals: qtc 423 QRS: narrow, q waves v1 ST changes: no st elevation Impression: abnormal ekg  RADIOLOGY I independently interpreted and visualized the CXR. My interpretation: No pneumonia Radiology interpretation:  IMPRESSION:  1. No acute findings.  2. Mild to moderate thoracic spondylosis.  3. Degenerative AC joint arthropathy bilaterally.     PROCEDURES:  Critical Care performed: No   MEDICATIONS ORDERED IN ED: Medications - No data to display   IMPRESSION / MDM / ASSESSMENT AND PLAN / ED COURSE  I reviewed the triage vital signs and the nursing notes.                              Differential diagnosis includes, but is not limited to, atrail fibrillation, sinus rhythm with arrythmia  Patient's presentation is most consistent with acute presentation with potential threat to life or bodily function.   Patient presented to the emergency department today from urgent care because  of concerns for A-fib.  Patient denies any chest pain or palpitations.  Blood work does show slight hypokalemia.  Patient's rate is well-controlled.  Discussed briefly with Dr. Okey Dupre over the telephone.  Will plan on discharging on low-dose beta-blocker and Eliquis. Discussed blood thinner precautions.   FINAL CLINICAL IMPRESSION(S) / ED DIAGNOSES   Final diagnoses:  Atrial fibrillation, unspecified type [facility])     Note:  This document was prepared using Dragon voice recognition software and may include unintentional dictation errors.    Phineas Semen, MD 01/31/23 [phone]

## 2023-01-31 NOTE — Progress Notes (Signed)
[facility]  Date: 01/31/23  Time: 12:45 PM  I was contacted by Dr. Derrill Kay in the ED regarding Christopher Daugherty, who presented from [place] [facility] urgent care this morning with new diagnosis of atrial fibrillation in the setting of UTI symptoms.  He denied cardiac symptoms.  Dr. Derrill Kay feels that he is stable for discharge home with rate control and anticoagulation.  He will initiate DOAC and low-dose metoprolol.  We will arrange for cardiology consultation in our office as soon as possible.  Yvonne Kendall, MD [facility]

## 2023-01-31 NOTE — Discharge Instructions (Signed)
Please seek medical attention for any high fevers, chest pain, shortness of breath, change in behavior, persistent vomiting, bloody stool or any other new or concerning symptoms.  

## 2023-01-31 NOTE — ED Triage Notes (Signed)
Pt comes from [facility] with c/o new onset of afib. Pt went to [facility] for possible UTI. Pt states doctor heard extra beats and performed and EKG showing AFIb.,

## 2023-02-01 ENCOUNTER — Ambulatory Visit: Payer: Medicare Other | Admitting: Cardiology

## 2023-02-01 NOTE — Progress Notes (Unsigned)
Cardiology Office Note Date:  02/02/2023  Patient ID:  Christopher, Daugherty Mar 31, 1956, MRN [id number] PCP:  Nira Retort  Cardiologist:  None Electrophysiologist: Lanier Prude, MD (though has not met  Chief Complaint: new onset Afib  History of Present Illness: Christopher Daugherty is a 67 y.o. male with PMH notable for new onset AFib, OSA, HTN; seen today for Lanier Prude, MD for acute visit due to new onset AFib. He was seen in the [facility] ER 01/31/2023 after presenting to [facility] clinic urgent care with sweats, chills, concern for infection however found to be in atrial fibrillation.  Patient denies chest pain, palpitations, was hemodynamically stable so close follow-up was scheduled.  He was started on anticoagulation Eliquis 5 mg twice daily, Lopressor 25 mg twice daily.   On follow-up today, patient states that he feels well. He clarifies the history from ER note. Has. This past weekend he was feeling unwell, and was not sure why.  He was watching an outdoor music performance with his fiance and thought that he was feeling unwell because of the heat.  Later that night, he was having lower abdominal pain with increased urinary urgency with dribbling.  He continued to feel unwell and proceeded to urgent care the following day, where he was diagnosed with a UTI and found to be in atrial fibrillation with RVR.  He was then transferred to the emergency room for further evaluation.  In the ER labs were grossly unremarkable.  He denies palpitations, chest pain, shortness of breath. No increased swelling.  Has good exercise tolerance, states that he mowed his yard this morning without incident.  He does not feel any different than he did 2 weeks, or 2 months ago.  He misunderstood medication directions from the ER, has been taking Eliquis and Lopressor both daily.  No bleeding concerns on Eliquis He has previously been diagnosed with OSA, has been diligently using CPAP as of  late.  His fiance is with him in clinic today, they are planning to get married this weekend.  AAD History: none  Past Medical History:  Diagnosis Date   Allergy    BPH (benign prostatic hyperplasia)    Hematuria    Hypertension    Morbid obesity (HCC)    Sleep apnea     Past Surgical History:  Procedure Laterality Date   COLONOSCOPY WITH PROPOFOL N/A 05/13/2022   Procedure: COLONOSCOPY WITH PROPOFOL;  Surgeon: Regis Bill, MD;  Location: [facility] ENDOSCOPY;  Service: Endoscopy;  Laterality: N/A;    Current Outpatient Medications  Medication Instructions   apixaban (ELIQUIS) 5 mg, Oral, 2 times daily   losartan (COZAAR) 50 mg, Oral, Daily   Maca Root (MACA PO) 1,000 mg, Oral, Daily   MAGNESIUM PO Oral, Daily   metoprolol tartrate (LOPRESSOR) 25 mg, Oral, 2 times daily   OVER THE COUNTER MEDICATION L-citrulline daily   Potassium 99 MG TABS 1 tablet, Oral, Daily   sildenafil (VIAGRA) 100 mg, Oral, As needed   solifenacin (VESICARE) 5 mg, Oral, Daily    Social History:  The patient  reports that he has never smoked. He has never used smokeless tobacco. He reports current alcohol use of about 4.0 standard drinks of alcohol per week. He reports that he does not use drugs.   Family History:   The patient's family history includes Diabetes in his mother; Heart attack in his mother; Hypertension in his mother.  ROS:  Please see the history of present illness. All  other systems are reviewed and otherwise negative.   PHYSICAL EXAM:  VS:  BP 100/70 (BP Location: Left Arm, Patient Position: Sitting, Cuff Size: Large)   Pulse (!) 109   Ht 5\' 8"  (1.727 m)   Wt 299 lb (135.6 kg)   SpO2 98%   BMI 45.46 kg/m  BMI: Body mass index is 45.46 kg/m.  GEN- The patient is well appearing, alert and oriented x 3 today.   Lungs- Clear to ausculation bilaterally, normal work of breathing.  Heart- Irregularly irregular rate and rhythm, no murmurs, rubs or gallops Extremities- Trace  peripheral edema, warm, dry   EKG is ordered. Personal review of EKG from today shows:    EKG Interpretation Date/Time:  Thursday February 02 2023 14:15:05 EDT Ventricular Rate:  109 PR Interval:    QRS Duration:  82 QT Interval:  292 QTC Calculation: 393 R Axis:   -13  Text Interpretation: Atrial fibrillation with rapid ventricular response When compared with ECG of 31-Jan-2023 09:57, No significant change was found Confirmed by Sherie Don [phone]) on 02/02/2023 2:18:17 PM    Recent Labs: 01/31/2023: BUN 21; Creatinine, Ser 0.78; Hemoglobin 15.3; Platelets 221; Potassium 3.0; Sodium 132  No results found for requested labs within last 365 days.   Estimated Creatinine Clearance: 122.4 mL/min (by C-G formula based on SCr of 0.78 mg/dL).   Wt Readings from Last 3 Encounters:  02/02/23 299 lb (135.6 kg)  01/31/23 (!) 304 lb (137.9 kg)  05/13/22 290 lb (131.5 kg)     Additional studies reviewed include: Previous EP, cardiology notes.     ASSESSMENT AND PLAN:  #) new onset AFib No cardiac awareness. patient denies chest pain, palpitations, increased fatigue TTE to further evaluate LVEF and cardiac function Recommended patient take Lopressor 25 mg twice daily Cardioversion in 4 weeks after uninterrupted twice daily Eliquis dosing - Preprocedure labs Briefly discussed that ablation is likely not an option given patient's current body habitus.  Patient states that he is working on losing weight, has lost 50 pounds in the last few years.   #) Hypercoagulable d/t A-fib CHA2DS2-VASc Score = at least 2 [CHF History: 0, HTN History: 1, Diabetes History: 0, Stroke History: 0, Vascular Disease History: 0, Age Score: 1, Gender Score: 0].  Therefore, the patient's annual risk of stroke is 2.2 %. NOAC - 5 mg Eliquis twice daily, appropriately dosed Patient misunderstood Eliquis directions, has only been taking daily, No bleeding concerns  #) HTN Well-controlled in office  #)  OSA Recommend nightly CPAP usage   Informed Consent   Shared Decision Making/Informed Consent The risks (stroke, cardiac arrhythmias rarely resulting in the need for a temporary or permanent pacemaker, skin irritation or burns and complications associated with conscious sedation including aspiration, arrhythmia, respiratory failure and death), benefits (restoration of normal sinus rhythm) and alternatives of a direct current cardioversion were explained in detail to Mr. Rumble and he agrees to proceed.        Current medicines are reviewed at length with the patient today.   The patient does not have concerns regarding his medicines.  The following changes were made today:   Start 5 mg Eliquis twice daily Start Lopressor 25 mg twice daily  Labs/ tests ordered today include:  Orders Placed This Encounter  Procedures   Basic Metabolic Panel (BMET)   CBC   EKG 12-Lead   ECHOCARDIOGRAM COMPLETE     Disposition: Follow up with EP APP as usual post procedure   Signed, Sherie Don,  NP  02/02/23  4:13 PM  Electrophysiology [facility]

## 2023-02-02 ENCOUNTER — Ambulatory Visit: Payer: Medicare Other | Attending: Cardiology | Admitting: Cardiology

## 2023-02-02 ENCOUNTER — Encounter: Payer: Self-pay | Admitting: Cardiology

## 2023-02-02 VITALS — BP 100/70 | HR 109 | Ht 68.0 in | Wt 299.0 lb

## 2023-02-02 DIAGNOSIS — I4891 Unspecified atrial fibrillation: Secondary | ICD-10-CM

## 2023-02-02 NOTE — Patient Instructions (Addendum)
Medication Instructions:  Your physician recommends that you continue on your current medications as directed. Please refer to the Current Medication list given to you today.  *If you need a refill on your cardiac medications before your next appointment, please call your pharmacy*   Lab Work: BMP and CBC in 2 weeks - Please go to the [facility]. You will check in at the front desk to the right as you walk into the atrium. Valet Parking is offered if needed. - No appointment needed. You may go any day between 7 am and 6 pm.  If you have labs (blood work) drawn today and your tests are completely normal, you will receive your results only by: MyChart Message (if you have MyChart) OR A paper copy in the mail If you have any lab test that is abnormal or we need to change your treatment, we will call you to review the results.   Testing/Procedures: Your physician has requested that you have an echocardiogram. Echocardiography is a painless test that uses sound waves to create images of your heart. It provides your doctor with information about the size and shape of your heart and how well your heart's chambers and valves are working. This procedure takes approximately one hour. There are no restrictions for this procedure. Please do NOT wear cologne, perfume, aftershave, or lotions (deodorant is allowed). Please arrive 15 minutes prior to your appointment time.    Dear Christopher Daugherty  You are scheduled for a Cardioversion on Thursday, August 8 with Dr. Okey Dupre.  Please arrive at the [facility] Entrance of [facility], [address], [place] [address] at 6:30 AM (This is 1 hour(s) prior to your procedure time).  Proceed to the Check-In Desk directly inside the entrance.  Procedure Parking: Use the entrance off of the [address] side of the hospital. Turn right upon entering and follow the driveway to parking that is directly in front of the [facility] Vascular Center. There is  no valet parking available at this entrance, however there is an awning directly in front of the [facility] Vascular Center for drop off/ pick up for patients.   DIET:  Nothing to eat or drink after midnight except a sip of water with medications (see medication instructions below)  MEDICATION INSTRUCTIONS: Continue taking your anticoagulant (blood thinner): Apixaban (Eliquis).  You will need to continue this after your procedure until you are told by your provider that it is safe to stop.    LABS: as listed above  FYI:  For your safety, and to allow [place] to monitor your vital signs accurately during the surgery/procedure we request: If you have artificial nails, gel coating, SNS etc, please have those removed prior to your surgery/procedure. Not having the nail coverings /polish removed may result in cancellation or delay of your surgery/procedure.  You must have a responsible person to drive you home and stay in the waiting area during your procedure. Failure to do so could result in cancellation.  Bring your insurance cards.  *Special Note: Every effort is made to have your procedure done on time. Occasionally there are emergencies that occur at the hospital that may cause delays. Please be patient if a delay does occur.   Follow-Up: At [facility], you and your health needs are our priority.  As part of our continuing mission to provide you with exceptional heart care, we have created designated Provider Care Teams.  These Care Teams include your  primary Cardiologist (physician) and Advanced Practice Providers (APPs -  Physician Assistants and Nurse Practitioners) who all work together to provide you with the care you need, when you need it.  We recommend signing up for the patient portal called "MyChart".  Sign up information is provided on this After Visit Summary.  MyChart is used to connect with patients for Virtual Visits (Telemedicine).  Patients are able to view lab/test  results, encounter notes, upcoming appointments, etc.  Non-urgent messages can be sent to your provider as well.   To learn more about what you can do with MyChart, go to [email or web address].    Your next appointment:   2-4 week(s) after cardioversion  Provider:   Sherie Don, NP

## 2023-02-04 ENCOUNTER — Emergency Department
Admission: EM | Admit: 2023-02-04 | Discharge: 2023-02-04 | Disposition: A | Discharge status: Home / Self Care | Payer: Medicare Other | Attending: Emergency Medicine | Admitting: Emergency Medicine

## 2023-02-04 ENCOUNTER — Other Ambulatory Visit: Payer: Self-pay

## 2023-02-04 DIAGNOSIS — I10 Essential (primary) hypertension: Secondary | ICD-10-CM | POA: Insufficient documentation

## 2023-02-04 DIAGNOSIS — W57XXXA Bitten or stung by nonvenomous insect and other nonvenomous arthropods, initial encounter: Secondary | ICD-10-CM | POA: Insufficient documentation

## 2023-02-04 DIAGNOSIS — S50361A Insect bite (nonvenomous) of right elbow, initial encounter: Secondary | ICD-10-CM | POA: Diagnosis present

## 2023-02-04 NOTE — ED Triage Notes (Signed)
Patient states he thinks he has a spider bite to right elbow, did not see spider or any insect; small amount of redness to right elbow.

## 2023-02-04 NOTE — ED Provider Notes (Signed)
[facility] Provider Note    Event Date/Time   First MD Initiated Contact with Patient 02/04/23 1800     (approximate)   History   Insect Bite   HPI  Christopher Daugherty is a 67 y.o. male with PMH of hypertension and new onset of A-fib presents for evaluation of an insect bite and palpitations that occurred earlier today.  Patient is unsure of exactly when he got the bite and does not know what could have bit him.  He came to the ER today because when he laid down to rest after being bit he had heart palpitations, which have since resolved.  He states that the swelling to the area of the bug bite has improved.  He denies pain and itchiness at the bite site.      Physical Exam   Triage Vital Signs: ED Triage Vitals [02/04/23 1620]  Encounter Vitals Group     BP (!) 147/68     Systolic BP Percentile      Diastolic BP Percentile      Pulse Rate 95     Resp 20     Temp 98 F (36.7 C)     Temp Source Oral     SpO2 95 %     Weight 299 lb (135.6 kg)     Height 5\' 8"  (1.727 m)     Head Circumference      Peak Flow      Pain Score 0     Pain Loc      Pain Education      Exclude from Growth Chart     Most recent vital signs: Vitals:   02/04/23 1620  BP: (!) 147/68  Pulse: 95  Resp: 20  Temp: 98 F (36.7 C)  SpO2: 95%    General: Awake, no distress.  CV:  Good peripheral perfusion.  Resp:  Normal effort.  Abd:  No distention.  Other:  3 cm area of erythema and induration just proximal to the elbow   ED Results / Procedures / Treatments   Labs (all labs ordered are listed, but only abnormal results are displayed) Labs Reviewed - No data to display    PROCEDURES:  Critical Care performed: No  Procedures   MEDICATIONS ORDERED IN ED: Medications - No data to display   IMPRESSION / MDM / ASSESSMENT AND PLAN / ED COURSE  I reviewed the triage vital signs and the nursing notes.                             67 year old male  presents for evaluation of an insect bite.  VSS in triage aside from elevated BP, however patient has history of HTN.  On exam patient is NAD.  Differential diagnosis includes, but is not limited to, insect bite, cellulitis, abscess.  Patient's presentation is most consistent with acute, uncomplicated illness.  Patient does not have signs of cellulitis or abscess development at this point in time.  His presentation is consistent with a localized reaction to an insect bite.  I advised patient to ice the area to help with the swelling.  If it begins to itch I explained he can apply hydrocortisone cream.  He can also take allergy medication as needed.  I explained signs of infection to look for and advised him to be seen by a provider should he develop these.  Patient stated that he presented to the ED  mostly because he was concerned about his palpitations which have since resolved.  I advised patient to continue taking his medication as prescribed by his cardiologist and to follow-up with him as needed.  Patient is stable for outpatient management.  All questions were answered and he was agreeable to plan.     FINAL CLINICAL IMPRESSION(S) / ED DIAGNOSES   Final diagnoses:  Insect bite of right elbow, initial encounter     Rx / DC Orders   ED Discharge Orders     None        Note:  This document was prepared using Dragon voice recognition software and may include unintentional dictation errors.   Cameron Ali, PA-C 02/04/23 Casilda Carls, MD 02/04/23 [phone]

## 2023-02-04 NOTE — Discharge Instructions (Signed)
You are having a localized reaction to an insect bite.  Continue to ice the area to reduce the swelling.  You can apply hydrocortisone cream if it becomes itchy.  You can also take allergy medicine.  Watch for signs of infection including warmth, redness, swelling and purulent discharge.  Should you develop these please be seen by medical provider in either the ED, urgent care or by your primary care provider.

## 2023-02-21 ENCOUNTER — Telehealth: Payer: Self-pay | Admitting: *Deleted

## 2023-02-21 NOTE — Telephone Encounter (Signed)
I spoke with pt concerning pending lab work for pre cardioversion. Pt is aware to go to [facility] to have lab work drawn by 02/27/2023. Pt understood and agreed to have lab work completed and continue with Cardioversion 03/02/2023.

## 2023-02-21 NOTE — Telephone Encounter (Signed)
-----   Message from [facility] Tees Toh N sent at 02/02/2023  3:21 PM EDT ----- Regarding: labwork Will you please make sure the patient gets his labs done for his cardioversion in 2 weeks. He is supposed to get them drawn at the [facility] today. Thank you!

## 2023-02-28 ENCOUNTER — Encounter: Payer: Self-pay | Admitting: Student

## 2023-03-01 ENCOUNTER — Ambulatory Visit: Payer: Medicare Other | Admitting: Anesthesiology

## 2023-03-01 ENCOUNTER — Other Ambulatory Visit: Payer: Self-pay

## 2023-03-01 ENCOUNTER — Encounter
Admission: RE | Disposition: A | Discharge status: Home / Self Care | Payer: Self-pay | Source: Ambulatory Visit | Attending: Internal Medicine

## 2023-03-01 ENCOUNTER — Encounter: Payer: Self-pay | Admitting: Internal Medicine

## 2023-03-01 ENCOUNTER — Other Ambulatory Visit: Payer: Medicare Other

## 2023-03-01 ENCOUNTER — Ambulatory Visit (HOSPITAL_BASED_OUTPATIENT_CLINIC_OR_DEPARTMENT_OTHER)
Admission: RE | Admit: 2023-03-01 | Discharge: 2023-03-01 | Disposition: A | Discharge status: Home / Self Care | Payer: Medicare Other | Source: Ambulatory Visit | Attending: Cardiology | Admitting: Cardiology

## 2023-03-01 ENCOUNTER — Ambulatory Visit
Admission: RE | Admit: 2023-03-01 | Discharge: 2023-03-01 | Disposition: A | Discharge status: Home / Self Care | Payer: Medicare Other | Source: Ambulatory Visit | Attending: Internal Medicine | Admitting: Internal Medicine

## 2023-03-01 DIAGNOSIS — Z7901 Long term (current) use of anticoagulants: Secondary | ICD-10-CM | POA: Insufficient documentation

## 2023-03-01 DIAGNOSIS — G4733 Obstructive sleep apnea (adult) (pediatric): Secondary | ICD-10-CM | POA: Diagnosis not present

## 2023-03-01 DIAGNOSIS — I480 Paroxysmal atrial fibrillation: Secondary | ICD-10-CM | POA: Diagnosis present

## 2023-03-01 DIAGNOSIS — Z6841 Body Mass Index (BMI) 40.0 and over, adult: Secondary | ICD-10-CM | POA: Diagnosis not present

## 2023-03-01 DIAGNOSIS — I4819 Other persistent atrial fibrillation: Secondary | ICD-10-CM | POA: Insufficient documentation

## 2023-03-01 DIAGNOSIS — R000 Tachycardia, unspecified: Secondary | ICD-10-CM | POA: Insufficient documentation

## 2023-03-01 DIAGNOSIS — Z79899 Other long term (current) drug therapy: Secondary | ICD-10-CM | POA: Diagnosis not present

## 2023-03-01 DIAGNOSIS — I10 Essential (primary) hypertension: Secondary | ICD-10-CM | POA: Insufficient documentation

## 2023-03-01 HISTORY — DX: Unspecified atrial fibrillation: I48.91

## 2023-03-01 HISTORY — PX: CARDIOVERSION: SHX1299

## 2023-03-01 HISTORY — PX: TEE WITHOUT CARDIOVERSION: SHX5443

## 2023-03-01 LAB — BASIC METABOLIC PANEL
Anion gap: 7 (ref 5–15)
BUN: 15 mg/dL (ref 8–23)
CO2: 27 mmol/L (ref 22–32)
Calcium: 8.7 mg/dL — ABNORMAL LOW (ref 8.9–10.3)
Chloride: 105 mmol/L (ref 98–111)
Creatinine, Ser: 0.93 mg/dL (ref 0.61–1.24)
GFR, Estimated: >60 mL/min (ref >60)
Glucose, Bld: 99 mg/dL (ref 70–99)
Potassium: 4.3 mmol/L (ref 3.5–5.1)
Sodium: 139 mmol/L (ref 135–145)

## 2023-03-01 LAB — CBC
HCT: 48.2 % (ref 39.0–52.0)
Hemoglobin: 15.6 g/dL (ref 13.0–17.0)
MCH: 31 pg (ref 26.0–34.0)
MCHC: 32.4 g/dL (ref 30.0–36.0)
MCV: 95.6 fL (ref 80.0–100.0)
Platelets: 246 10*3/uL (ref 150–400)
RBC: 5.04 MIL/uL (ref 4.22–5.81)
RDW: 13.5 % (ref 11.5–15.5)
WBC: 7.3 10*3/uL (ref 4.0–10.5)
nRBC: 0 % (ref 0.0–0.2)

## 2023-03-01 LAB — ECHO TEE

## 2023-03-01 SURGERY — TRANSESOPHAGEAL ECHOCARDIOGRAM (TEE)
Anesthesia: General

## 2023-03-01 MED ORDER — SODIUM CHLORIDE 0.9 % IV SOLN: INTRAVENOUS | Status: DC

## 2023-03-01 MED ORDER — SODIUM CHLORIDE FLUSH 0.9 % IV SOLN
INTRAVENOUS | Status: AC
  Filled 2023-03-01: qty 10

## 2023-03-01 MED ORDER — BUTAMBEN-TETRACAINE-BENZOCAINE 2-2-14 % EX AERO
INHALATION_SPRAY | CUTANEOUS | Status: AC
  Filled 2023-03-01: qty 5

## 2023-03-01 MED ORDER — LIDOCAINE HCL (PF) 2 % IJ SOLN
INTRAMUSCULAR | Status: DC | PRN
  Administered 2023-03-01: 100 mg via INTRADERMAL

## 2023-03-01 MED ORDER — LIDOCAINE HCL (PF) 2 % IJ SOLN
INTRAMUSCULAR | Status: AC
  Filled 2023-03-01: qty 5

## 2023-03-01 MED ORDER — PROPOFOL 10 MG/ML IV BOLUS
INTRAVENOUS | Status: DC | PRN
Start: 2023-03-01 — End: 2023-03-01
  Administered 2023-03-01: 50 mg via INTRAVENOUS
  Administered 2023-03-01 (×4): 40 mg via INTRAVENOUS
  Administered 2023-03-01: 90 mg via INTRAVENOUS
  Administered 2023-03-01: 40 mg via INTRAVENOUS

## 2023-03-01 MED ORDER — LIDOCAINE VISCOUS HCL 2 % MT SOLN
OROMUCOSAL | Status: AC
  Filled 2023-03-01: qty 15

## 2023-03-01 MED ORDER — FENTANYL CITRATE (PF) 100 MCG/2ML IJ SOLN: INTRAMUSCULAR | Status: DC | PRN

## 2023-03-01 MED ORDER — PROPOFOL 1000 MG/100ML IV EMUL
INTRAVENOUS | Status: AC
  Filled 2023-03-01: qty 100

## 2023-03-01 NOTE — Transfer of Care (Signed)
Immediate Anesthesia Transfer of Care Note  Patient: DARRYLE MAHFOUZ  Procedure(s) Performed: TRANSESOPHAGEAL ECHOCARDIOGRAM (TEE) CARDIOVERSION  Patient Location: PACU and Nursing Unit  Anesthesia Type:General  Level of Consciousness: drowsy  Airway & Oxygen Therapy: Patient Spontanous Breathing  Post-op Assessment: Report given to RN and Post -op Vital signs reviewed and stable  Post vital signs: Reviewed and stable  Last Vitals:  Vitals Value Taken Time  BP 110/65 03/01/23 1333  Temp 97 03/01/23  1337  Pulse 64 03/01/23 1336  Resp 24 03/01/23 1336  SpO2 98 % 03/01/23 1336    Last Pain:  Vitals:   03/01/23 1231  TempSrc: Oral  PainSc: 0-No pain         Complications: No notable events documented.

## 2023-03-01 NOTE — H&P (Signed)
Chief Complaint:    Chief Complaint  Patient presents with   Atrial Fibrillation   Date of Service: 02/03/2023 Date of Birth: April 10, 1956 PCP: Nadara Mustard, MD  History of Present Illness: Mr. Christopher Daugherty is a 67 y.o.male patient who history of recent atrial fibrillation tachycardia on anticoagulation rate control has history of hypertension possible obstructive apnea obesity tachycardia was seen in emergency room then referred for further evaluation   Past Medical and Surgical History  Past Medical History Past Medical History      Past Medical History:  Diagnosis Date   Allergic rhinitis     BPH (benign prostatic hyperplasia)     HTN (hypertension)     Low testosterone     Obesity     Sleep apnea          Past Surgical History He has a past surgical history that includes Colonoscopy (N/A, 08/03/2011) and Colonoscopy (05/13/2022).    Medications and Allergies  Current Medications Current Medications        Current Outpatient Medications  Medication Sig Dispense Refill   apixaban (ELIQUIS) 5 mg tablet Take 5 mg by mouth 2 (two) times daily       CAT'S CLAW, UNCARIA TOMENTOSA, ORAL Take 1,000 mg by mouth once daily       maca extract 500 mg Cap Take 1,900 mg by mouth once daily       MAGNESIUM ORAL Take 1 tablet by mouth once daily       metoprolol tartrate (LOPRESSOR) 50 MG tablet Take 0.5 tablets (25 mg total) by mouth 2 (two) times daily 30 tablet 11   POTASSIUM ORAL Take 1 tablet by mouth once daily       PURE L-CITRULLINE ORAL Take 750 mg by mouth once daily       sildenafiL (VIAGRA) 100 MG tablet Take 1 tablet (100 mg total) by mouth once daily as needed for Erectile Dysfunction for up to 30 days 10 tablet 1   solifenacin (VESICARE) 10 MG tablet Take 1 tablet (10 mg total) by mouth once daily 90 tablet 1   AMIOdarone (PACERONE) 200 MG tablet Take 1 tablet (200 mg total) by mouth 2 (two) times daily Take twice a day for two weeks and then start taking once a  day. 60 tablet 0   losartan (COZAAR) 25 MG tablet Take 1 tablet (25 mg total) by mouth once daily 30 tablet 11    No current facility-administered medications for this visit.        Allergies Patient has no known allergies.   Social and Family History  Social History  reports that he has never smoked. He has never used smokeless tobacco. He reports current alcohol use of about 3.0 standard drinks of alcohol per week. He reports that he does not use drugs.   Family History family history includes Alcohol abuse in his father; Glaucoma in his maternal uncle; Heart disease in his maternal grandmother and mother; High blood pressure (Hypertension) in his mother; Lung cancer in his maternal grandfather and paternal grandfather; Myocardial Infarction (Heart attack) in his paternal grandmother; Obesity in his brother.    Review of Systems    Review of Systems: The patient denies chest pain, shortness of breath, orthopnea, paroxysmal nocturnal dyspnea, pedal edema, palpitations, heart racing, presyncope, syncope. Review of 12 Systems is negative except as described above.   Physical Examination    Vitals:BP 110/70 (BP Location: Left upper arm, Patient Position: Sitting, BP Cuff Size: Large  Adult)   Pulse 84   Resp 15   Ht 172.7 cm (5\' 8" )   Wt (!) 135.5 kg (298 lb 12.8 oz)   SpO2 97%   BMI 45.43 kg/m  Ht:172.7 cm (5\' 8" ) Wt:(!) 135.5 kg (298 lb 12.8 oz) WGN:FAOZ surface area is 2.55 meters squared. Body mass index is 45.43 kg/m.   HEENT: Pupils equally reactive to light and accomodation    Neck: Supple without thyromegaly, carotid pulses 2+ Lungs: clear to auscultation bilaterally; no wheezes, rales, rhonchi Heart: Irregular irregular rate and rhythm.  No gallops, murmurs or rub Abdomen: soft nontender, nondistended, with normal bowel sounds Extremities: no cyanosis, clubbing, or edema Peripheral Pulses: 2+ in all extremities, 2+ femoral pulses bilaterally Neurologic: Alert and  oriented X3; speech intact; face symmetrical; moves all extremities well   Assessment    67 y.o. male with  1. Atrial fibrillation, unspecified type (CMS/HHS-HCC)   2. Primary hypertension   3. Obstructive sleep apnea   4. Morbid obesity with BMI of 40.0-44.9, adult (CMS/HHS-HCC)   5. Tachycardia             Plan  Atrial fibrillation paroxysmal recommend start amiodarone load orally continue anticoagulation proceed with TEE cardioversion Echocardiogram for evaluation of atrial fibrillation valvular structure Obesity recommend modest weight loss exercise portion control Metoprolol 50 mg twice a day to help with rate control Reduce losartan to 25 mg a day because of borderline hypertension and increase metoprolol dose Possible obstructive sleep apnea recommend sleep study CPAP if indicated weight loss Have the patient follow-up in 1 month

## 2023-03-01 NOTE — Anesthesia Preprocedure Evaluation (Signed)
Anesthesia Evaluation  Patient identified by MRN, date of birth, ID band Patient awake    Reviewed: Allergy & Precautions, NPO status , Patient's Chart, lab work & pertinent test results  History of Anesthesia Complications Negative for: history of anesthetic complications  Airway Mallampati: III  TM Distance: >3 FB Neck ROM: Full    Dental  (+) Partial Upper   Pulmonary sleep apnea and Continuous Positive Airway Pressure Ventilation , neg COPD, Patient abstained from smoking.Not current smoker   Pulmonary exam normal breath sounds clear to auscultation       Cardiovascular Exercise Tolerance: Good METShypertension, (-) CAD and (-) Past MI + dysrhythmias Atrial Fibrillation + Valvular Problems/Murmurs MR and AI  Rhythm:Irregular Rate:Normal - Systolic murmurs INTERPRETATION  NORMAL LEFT VENTRICULAR SYSTOLIC FUNCTION WITH AN ESTIMATED EF = 50-55 %  MILD RV SYSTOLIC DYSFUNCTION (See above)  MODERATE TRICUSPID AND MITRAL VALVE INSUFFICIENCY  MILD AORTIC VALVE INSUFFICIENCY  NO VALVULAR STENOSIS  MODERATE RV ENLARGEMENT  MODERATE BIATRIAL ENLARGEMENT     Neuro/Psych negative neurological ROS  negative psych ROS   GI/Hepatic ,neg GERD  ,,(+)     (-) substance abuse    Endo/Other  neg diabetes  Morbid obesity  Renal/GU negative Renal ROS     Musculoskeletal   Abdominal  (+) + obese  Peds  Hematology   Anesthesia Other Findings Past Medical History: No date: Allergy No date: Atrial fibrillation (HCC) No date: BPH (benign prostatic hyperplasia) No date: Hematuria No date: Hypertension No date: Morbid obesity (HCC) No date: Sleep apnea  Reproductive/Obstetrics                             Anesthesia Physical Anesthesia Plan  ASA: 3  Anesthesia Plan: General   Post-op Pain Management: Minimal or no pain anticipated   Induction: Intravenous  PONV Risk Score and Plan: 2 and Propofol  infusion, TIVA and Ondansetron  Airway Management Planned: Natural Airway and Nasal CPAP  Additional Equipment: None  Intra-op Plan:   Post-operative Plan:   Informed Consent: I have reviewed the patients History and Physical, chart, labs and discussed the procedure including the risks, benefits and alternatives for the proposed anesthesia with the patient or authorized representative who has indicated his/her understanding and acceptance.     Dental advisory given  Plan Discussed with: CRNA and Surgeon  Anesthesia Plan Comments: (Discussed risks of anesthesia with patient, including possibility of difficulty with spontaneous ventilation under anesthesia necessitating airway intervention, PONV, and rare risks such as cardiac or respiratory or neurological events, and allergic reactions. Discussed the role of CRNA in patient's perioperative care. Patient understands. Patient informed about increased incidence of above perioperative risk due to high BMI. Patient understands.  )       Anesthesia Quick Evaluation

## 2023-03-01 NOTE — CV Procedure (Signed)
TEE: Under moderate sedation, TEE was performed without complications: LV: Normal. Normal EF. RV: Normal LA: Normal but significant amount of smoke is seen in the LA.Marland Kitchen Left atrial appendage: Normal without thrombus  Normal function. Inter atrial septum is intact without defect. Double contrast study negative for atrial level shunting.  RA: Normal   Thoracic and ascending aorta: Normal without significant plaque or atheromatous changes.  Direct current cardioversion 03/01/2023 1:35 PM  Indication symptomatic A. Fibrillation.  Procedure: Using  mg of IV Propofol and  IV Lidocaine (for reducing venous pain) for achieving deep sedation, synchronized direct current cardioversion performed. Patient was delivered with 200 Joules of electricity X 1 with success to NSR. Patient tolerated the procedure well. No immediate complication noted.   Patient stable for discharge home approximately 1 hour after procedure.   Christopher Searing Akil Hoos, DO 03/01/23 1:36 PM

## 2023-03-01 NOTE — Anesthesia Postprocedure Evaluation (Signed)
Anesthesia Post Note  Patient: Christopher Daugherty  Procedure(s) Performed: TRANSESOPHAGEAL ECHOCARDIOGRAM (TEE) CARDIOVERSION  Patient location during evaluation: Specials Recovery Anesthesia Type: General Level of consciousness: awake and alert Pain management: pain level controlled Vital Signs Assessment: post-procedure vital signs reviewed and stable Respiratory status: spontaneous breathing, nonlabored ventilation, respiratory function stable and patient connected to nasal cannula oxygen Cardiovascular status: blood pressure returned to baseline and stable Postop Assessment: no apparent nausea or vomiting Anesthetic complications: no   No notable events documented.   Last Vitals:  Vitals:   03/01/23 1340 03/01/23 1345  BP: 107/66 98/67  Pulse: 62 68  Resp: (!) 22 13  Temp:    SpO2: 96% 97%    Last Pain:  Vitals:   03/01/23 1231  TempSrc: Oral  PainSc: 0-No pain                 Corinda Gubler

## 2023-03-01 NOTE — Progress Notes (Signed)
*  PRELIMINARY RESULTS* Echocardiogram Echocardiogram Transesophageal has been performed.  [place], [place] 03/01/2023, 1:32 PM

## 2023-03-02 ENCOUNTER — Encounter: Payer: Self-pay | Admitting: Internal Medicine

## 2023-03-02 ENCOUNTER — Ambulatory Visit: Admission: RE | Admit: 2023-03-02 | Payer: Medicare Other | Source: Ambulatory Visit | Admitting: Internal Medicine

## 2023-03-02 ENCOUNTER — Encounter: Admission: RE | Payer: Self-pay | Source: Ambulatory Visit

## 2023-03-02 DIAGNOSIS — I480 Paroxysmal atrial fibrillation: Secondary | ICD-10-CM

## 2023-03-02 SURGERY — CARDIOVERSION
Anesthesia: General

## 2023-03-14 ENCOUNTER — Encounter: Payer: Medicare Other | Admitting: Urology

## 2023-03-17 ENCOUNTER — Ambulatory Visit: Payer: Medicare Other | Admitting: Cardiology

## 2023-03-22 ENCOUNTER — Ambulatory Visit (INDEPENDENT_AMBULATORY_CARE_PROVIDER_SITE_OTHER): Payer: Medicare Other | Admitting: Urology

## 2023-03-22 ENCOUNTER — Other Ambulatory Visit: Payer: Self-pay

## 2023-03-22 ENCOUNTER — Encounter: Payer: Self-pay | Admitting: Urology

## 2023-03-22 ENCOUNTER — Other Ambulatory Visit
Admission: RE | Admit: 2023-03-22 | Discharge: 2023-03-22 | Disposition: A | Discharge status: Home / Self Care | Payer: Medicare Other | Attending: Urology | Admitting: Urology

## 2023-03-22 VITALS — BP 124/72 | HR 65 | Ht 69.0 in | Wt 296.6 lb

## 2023-03-22 DIAGNOSIS — Z87440 Personal history of urinary (tract) infections: Secondary | ICD-10-CM

## 2023-03-22 DIAGNOSIS — N390 Urinary tract infection, site not specified: Secondary | ICD-10-CM | POA: Insufficient documentation

## 2023-03-22 DIAGNOSIS — N3281 Overactive bladder: Secondary | ICD-10-CM | POA: Diagnosis not present

## 2023-03-22 LAB — URINALYSIS, COMPLETE (UACMP) WITH MICROSCOPIC
Bilirubin Urine: NEGATIVE
Glucose, UA: NEGATIVE mg/dL
Hgb urine dipstick: NEGATIVE
Ketones, ur: NEGATIVE mg/dL
Nitrite: POSITIVE — AB
Protein, ur: NEGATIVE mg/dL
Specific Gravity, Urine: 1.02 (ref 1.005–1.030)
Squamous Epithelial / HPF: NONE SEEN /HPF (ref 0–5)
WBC, UA: >50 WBC/hpf (ref 0–5)
pH: 7 (ref 5.0–8.0)

## 2023-03-22 LAB — BLADDER SCAN AMB NON-IMAGING

## 2023-03-22 MED ORDER — SULFAMETHOXAZOLE-TRIMETHOPRIM 800-160 MG PO TABS: 1.0000 | ORAL_TABLET | Freq: Two times a day (BID) | ORAL | 0 refills | Status: AC

## 2023-03-22 NOTE — Progress Notes (Signed)
   03/22/23 10:47 AM   Christopher Daugherty 01/10/1956 [id number]  CC: Recurrent UTI, OAB  HPI: 67 year old male with morbid obesity and BMI of 44 who was referred for recurrent E. coli culture documented UTI since early July 2024.  Primary symptoms were frequency, dysuria, and pelvic pain.  Culture positive for E. coli on 01/31/2023, 02/14/2023, and reportedly at an outside urgent care in August 2024.  He has been treated with cefuroxime x 10 days and nitrofurantoin x 7 days.  Symptoms have improved somewhat, urinalysis today though appears infected with greater than 50 WBC, 0-5 RBC, many bacteria, WBC clumps present, leukocytes small, nitrite positive.  Will send for culture.  He denies any fevers or chills.  No prior imaging to review.  PVR normal today at 50ml.  He also has OAB and has been on oxybutynin long-term from Dr. Mena Goes which helps with his frequency, currently only having nocturia once per night.  PSA in May 2023 was normal at 0.67.  PMH: Past Medical History:  Diagnosis Date   Allergy    Atrial fibrillation (HCC)    BPH (benign prostatic hyperplasia)    Hematuria    Hypertension    Morbid obesity (HCC)    Sleep apnea     Surgical History: Past Surgical History:  Procedure Laterality Date   CARDIOVERSION N/A 03/01/2023   Procedure: CARDIOVERSION;  Surgeon: Clotilde Dieter, DO;  Location: [facility];  Service: Cardiovascular;  Laterality: N/A;   COLONOSCOPY WITH PROPOFOL N/A 05/13/2022   Procedure: COLONOSCOPY WITH PROPOFOL;  Surgeon: Regis Bill, MD;  Location: [facility];  Service: Endoscopy;  Laterality: N/A;   TEE WITHOUT CARDIOVERSION N/A 03/01/2023   Procedure: TRANSESOPHAGEAL ECHOCARDIOGRAM (TEE);  Surgeon: Clotilde Dieter, DO;  Location: [facility];  Service: Cardiovascular;  Laterality: N/A;      Family History: Family History  Problem Relation Age of Onset   Hypertension Mother    Heart attack Mother    Diabetes Mother     Social History:   reports that he has never smoked. He has never used smokeless tobacco. He reports current alcohol use of about 4.0 standard drinks of alcohol per week. He reports that he does not use drugs.  Physical Exam: BP 124/72 (BP Location: Left Arm, Patient Position: Sitting, Cuff Size: Large)   Pulse 65   Ht 5\' 9"  (1.753 m)   Wt 296 lb 9.6 oz (134.5 kg)   BMI 43.80 kg/m    Constitutional:  Alert and oriented, No acute distress. Cardiovascular: No clubbing, cyanosis, or edema. Respiratory: Normal respiratory effort, no increased work of breathing. GI: Abdomen is soft, nontender, nondistended, no abdominal masses   Assessment & Plan:   67 year old male with morbid obesity and OAB on oxybutynin who presents with recurrent E. coli UTIs over the last 2 months, urinalysis appears grossly infected today.  Suspect his symptoms are more consistent with a prostatitis and I recommended a 3-week course of Bactrim for better prostate penetration, as well as a CT urogram to evaluate for any stone disease, abscess, or obstruction that could explain his recurrent infections.  Urine sent for culture and atypicals Bactrim DS twice daily x 3 weeks CT for further evaluation of infections, call with results  Legrand Rams, MD 03/22/2023  [facility] [address] [place], [place] [address] [phone]

## 2023-03-24 LAB — URINE CULTURE

## 2023-03-29 LAB — MISC LABCORP TEST (SEND OUT): Labcorp test code: 86884

## 2023-04-04 ENCOUNTER — Ambulatory Visit: Admit: 2023-04-04 | Payer: Medicare Other

## 2023-04-04 SURGERY — COLONOSCOPY WITH PROPOFOL
Anesthesia: General

## 2023-04-28 DIAGNOSIS — N390 Urinary tract infection, site not specified: Secondary | ICD-10-CM

## 2023-05-03 ENCOUNTER — Ambulatory Visit
Admission: RE | Admit: 2023-05-03 | Discharge: 2023-05-03 | Disposition: A | Discharge status: Home / Self Care | Payer: Medicare Other | Source: Ambulatory Visit | Attending: Urology | Admitting: Urology

## 2023-05-03 DIAGNOSIS — N390 Urinary tract infection, site not specified: Secondary | ICD-10-CM | POA: Diagnosis present

## 2023-05-03 DIAGNOSIS — B9689 Other specified bacterial agents as the cause of diseases classified elsewhere: Secondary | ICD-10-CM | POA: Diagnosis not present

## 2023-05-03 DIAGNOSIS — R319 Hematuria, unspecified: Secondary | ICD-10-CM | POA: Insufficient documentation

## 2023-05-03 MED ORDER — CEFDINIR 300 MG PO CAPS
300.0000 mg | ORAL_CAPSULE | Freq: Two times a day (BID) | ORAL | 0 refills | Status: AC
Start: 2023-05-03 — End: 2023-05-10

## 2023-05-03 MED ORDER — IOHEXOL 300 MG/ML  SOLN
100.0000 mL | Freq: Once | INTRAMUSCULAR | Status: AC | PRN
  Administered 2023-05-03: 100 mL via INTRAVENOUS

## 2023-05-15 NOTE — Telephone Encounter (Signed)
Appointment scheduled.

## 2023-05-23 ENCOUNTER — Ambulatory Visit (INDEPENDENT_AMBULATORY_CARE_PROVIDER_SITE_OTHER): Payer: Medicare Other | Admitting: Urology

## 2023-05-23 ENCOUNTER — Encounter: Payer: Self-pay | Admitting: Urology

## 2023-05-23 VITALS — BP 126/75 | HR 65

## 2023-05-23 DIAGNOSIS — R300 Dysuria: Secondary | ICD-10-CM | POA: Diagnosis not present

## 2023-05-23 DIAGNOSIS — R8281 Pyuria: Secondary | ICD-10-CM | POA: Diagnosis not present

## 2023-05-23 DIAGNOSIS — N3281 Overactive bladder: Secondary | ICD-10-CM | POA: Diagnosis not present

## 2023-05-23 LAB — URINALYSIS, COMPLETE
Bilirubin, UA: NEGATIVE
Glucose, UA: NEGATIVE
Ketones, UA: NEGATIVE
Nitrite, UA: POSITIVE — AB
Protein,UA: NEGATIVE
RBC, UA: NEGATIVE
Specific Gravity, UA: <1.005 — ABNORMAL LOW (ref 1.005–1.030)
Urobilinogen, Ur: 0.2 mg/dL (ref 0.2–1.0)
pH, UA: 5.5 (ref 5.0–7.5)

## 2023-05-23 LAB — MICROSCOPIC EXAMINATION

## 2023-05-23 MED ORDER — CEFUROXIME AXETIL 500 MG PO TABS
500.0000 mg | ORAL_TABLET | Freq: Two times a day (BID) | ORAL | 0 refills | Status: DC
Start: 2023-05-23 — End: 2023-06-07

## 2023-05-23 NOTE — Progress Notes (Signed)
05/23/2023 3:42 PM   [name] 02-07-1956 [id number]  Referring provider: Kandyce Rud, MD [phone] [address] [facility] [place],  [place] [address]  Urological history: 1. rUTI's -Contribute factors of age, BPH, obesity, prediabetic,   2. OAB -Contribute factors of age, BPH, obesity, prediabetic, allergies, hypertension, alcohol consumption and sleep apnea -solifenacin 10 mg daily   3. ED -Contribute factors of age, BPH, obesity, prediabetic, hypertension, sleep apnea and alcohol consumption -Sildenafil 100 mg on demand dosing  4.  Hypogonadism -Contributing factors of age, obesity, prediabetes and sleep apnea -not on TRT   Chief Complaint  Patient presents with   Follow-up    HPI: Christopher Daugherty is a 67 y.o. male  who presents today for painful urination.   Previous records reviewed.   He was seen by Dr. Richardo Hanks on March 22, 2023 for recurrent UTIs and OAB.   He was placed on 3 weeks of Bactrim DS and had a CT urogram for further evaluation.  CT urogram was negative.   He has since had another UTI with E. coli for which he was placed on Omnicef for 7 days on October 4th.  He then was instructed to follow-up with [place] again.  He states his symptoms abate while he is on the [place], but then they return 2 to 3 days after he completes the antibiotic.   Over the weekend he started to experience the frequency, urgency and dysuria.  Patient denies any modifying or aggravating factors.  Patient denies any recent UTI's, gross hematuria or suprapubic/flank pain.  Patient denies any fevers, chills, nausea or vomiting.    He started AZO which helped the discomfort.  He also had been taking cranberry tablets since he received his CT scan results in August.    He stated he was doing some research and noted that some people are placed on a low-dose antibiotic for awhile and he is wondering if he could have a low-dose antibiotic for a while  as well.  UA pyuria  PMH: Past Medical History:  Diagnosis Date   Allergy    Atrial fibrillation (HCC)    BPH (benign prostatic hyperplasia)    Hematuria    Hypertension    Morbid obesity (HCC)    Sleep apnea     Surgical History: Past Surgical History:  Procedure Laterality Date   CARDIOVERSION N/A 03/01/2023   Procedure: CARDIOVERSION;  Surgeon: Clotilde Dieter, DO;  Location: [facility];  Service: Cardiovascular;  Laterality: N/A;   COLONOSCOPY WITH PROPOFOL N/A 05/13/2022   Procedure: COLONOSCOPY WITH PROPOFOL;  Surgeon: Regis Bill, MD;  Location: [facility];  Service: Endoscopy;  Laterality: N/A;   TEE WITHOUT CARDIOVERSION N/A 03/01/2023   Procedure: TRANSESOPHAGEAL ECHOCARDIOGRAM (TEE);  Surgeon: Clotilde Dieter, DO;  Location: [facility];  Service: Cardiovascular;  Laterality: N/A;    Home Medications:  Allergies as of 05/23/2023   No Known Allergies      Medication List        Accurate as of May 23, 2023  3:42 PM. If you have any questions, ask your nurse or doctor.          STOP taking these medications    apixaban 5 MG Tabs tablet Commonly known as: ELIQUIS Stopped by: Ahlijah Raia       TAKE these medications    amiodarone 200 MG tablet Commonly known as: PACERONE TAKE 1 TABLET BY MOUTH 2 TIMES A DAY FOR 2 WEEKS,  THEN TAKE 1 TABLET BY MOUTH DAILY   aspirin EC 81 MG tablet Take by mouth.   Biotin 5000 MCG Caps Take 5,000 capsules by mouth daily.   Cats Claw 1000 MG Caps Take by mouth daily.   cefUROXime 500 MG tablet Commonly known as: CEFTIN Take 1 tablet (500 mg total) by mouth 2 (two) times daily with a meal. Started by: Kawthar Ennen   losartan 25 MG tablet Commonly known as: COZAAR Take 1 tablet by mouth daily.   MACA PO Take 1,000 mg by mouth daily.   magnesium 84 MG ( ) Tbcr SR tablet Commonly known as: MAGTAB Take 84 mg by mouth daily.   MAGNESIUM PO Take by mouth daily.   metoprolol tartrate  25 MG tablet Commonly known as: LOPRESSOR Take 1 tablet (25 mg total) by mouth 2 (two) times daily.   Na Sulfate-K Sulfate-Mg Sulf 17.5-3.13-1.6 GM/177ML Soln Take by mouth.   OVER THE COUNTER MEDICATION L-citrulline daily   Potassium 99 MG Tabs Take 1 tablet by mouth daily.   sildenafil 100 MG tablet Commonly known as: VIAGRA Take 100 mg by mouth as needed for erectile dysfunction.   solifenacin 10 MG tablet Commonly known as: VESICARE Take 10 mg by mouth daily.        Allergies: No Known Allergies  Family History: Family History  Problem Relation Age of Onset   Hypertension Mother    Heart attack Mother    Diabetes Mother     Social History:  reports that he has never smoked. He has never used smokeless tobacco. He reports current alcohol use of about 4.0 standard drinks of alcohol per week. He reports that he does not use drugs.  ROS: Pertinent ROS in HPI  Physical Exam: BP 126/75   Pulse 65   Constitutional:  Well nourished. Alert and oriented, No acute distress. HEENT: [place] AT, moist mucus membranes.  Trachea midline Cardiovascular: No clubbing, cyanosis, or edema. Respiratory: Normal respiratory effort, no increased work of breathing. Neurologic: Grossly intact, no focal deficits, moving all 4 extremities. Psychiatric: Normal mood and affect.  Laboratory Data: Lab Results  Component Value Date   WBC 7.3 03/01/2023   HGB 15.6 03/01/2023   HCT 48.2 03/01/2023   MCV 95.6 03/01/2023   PLT 246 03/01/2023    Lab Results  Component Value Date   CREATININE 0.93 03/01/2023    Urinalysis See EPIC and HPI I have reviewed the labs.   Pertinent Imaging: N/A  Assessment & Plan:    1. Dysuria/rUTI's -ua w/ pyruia -urine sent for culture --Started empirically on Omnicef, will adjust if necessary once urine culture and sensitivity results are available  -Explained that we need to pursue cystoscopy to evaluate internal bladder anatomy before we  consider placing on a low-dose antibiotic -Explained that the cystoscopy is a safe and common diagnostic test performed by one of our physicians  in the office.  It consist of using a thin, lighted tube to look directly inside the bladder, prostate and urethra to evaluate the anatomy.  The procedure is brief, typically taking about 5 minutes. -This will enable [place] to assess bladder health, diagnose and enlarged prostate, assess which BPH procedure may be most appropriate and rule out other bladder conditions (stricture disease, stones, cancer, etc.)  -Advised the patient that there are no restrictions to eating or drinking prior to the cystoscopy -They can continue to take all of their medications as prescribed -They can drive themselves to and from the appointment -I  explained that during the procedure, the area around the urethra will be cleansed thoroughly, topical anesthetic will be applied to numb your urethra, the thin tube is then gently inserted through the urethra into your bladder while fluid flows through the tube to the bladder to enable better visualization -I explained the procedure is usually not painful, however there may be some discomfort (pinching feeling), and they may feel an urge to urinate, coolness or fullness in the bladder and then the cystoscope is removed -After the cystoscopy, I advised them that they may experience urinary frequency, hematuria, dysuria which will resolve within 24 to 48 hours -Reviewed red flag signs (fever, bright red blood or blood clots in the urine, abdominal pain or difficulty urinating) and to contact the office immediately or seek treatment in the ED if they should experience any of these -The physician will discuss the results of the cystoscopy at the time of the procedure  -return for cysto   2. OAB -continue Vesicare   Return for cysto for rUTI's .  These notes generated with voice recognition software. I apologize for typographical  errors.  Cloretta Ned  [facility] [address]  [address] [place], [place] [address] [phone]

## 2023-05-26 LAB — CULTURE, URINE COMPREHENSIVE

## 2023-05-28 ENCOUNTER — Other Ambulatory Visit: Payer: Self-pay | Admitting: Urology

## 2023-05-28 MED ORDER — CEFDINIR 300 MG PO CAPS: 300.0000 mg | ORAL_CAPSULE | Freq: Two times a day (BID) | ORAL | 0 refills | Status: DC

## 2023-06-07 ENCOUNTER — Ambulatory Visit: Payer: Medicare Other | Admitting: Urology

## 2023-06-07 ENCOUNTER — Encounter: Payer: Self-pay | Admitting: Urology

## 2023-06-07 VITALS — BP 135/77 | HR 64 | Ht 69.0 in | Wt 301.0 lb

## 2023-06-07 DIAGNOSIS — Z87440 Personal history of urinary (tract) infections: Secondary | ICD-10-CM | POA: Diagnosis not present

## 2023-06-07 DIAGNOSIS — N390 Urinary tract infection, site not specified: Secondary | ICD-10-CM

## 2023-06-07 MED ORDER — CEFDINIR 300 MG PO CAPS: 300.0000 mg | ORAL_CAPSULE | Freq: Two times a day (BID) | ORAL | 0 refills | Status: DC

## 2023-06-07 MED ORDER — TRIMETHOPRIM 100 MG PO TABS: 100.0000 mg | ORAL_TABLET | Freq: Every day | ORAL | 0 refills | Status: DC

## 2023-06-07 NOTE — Progress Notes (Signed)
Cystoscopy Procedure Note:  Indication: recurrent UTI  After informed consent and discussion of the procedure and its risks, Christopher Daugherty was positioned and prepped in the standard fashion. Cystoscopy was performed with a flexible cystoscope. The urethra, bladder neck and entire bladder was visualized in a standard fashion. The prostate was small. The ureteral orifices were visualized in their normal location and orientation.  Bladder mucosa grossly normal throughout  Imaging: CTU with no urologic abnormalities  Findings: Normal cystoscopy   Assessment and Plan: 67 year old male with recurrent UTI/prostatitis of unclear etiology.  I recommended extending current course of antibiotics for a total of 5 weeks for possible prostatitis, followed by low-dose daily trimethoprim prophylaxis for 60 days.  Reassurance provided regarding normal cystoscopy and CT.  Continue cranberry tablet prophylaxis.  Keep follow-up as scheduled February 2025  Legrand Rams, MD 06/07/2023

## 2023-06-20 NOTE — Progress Notes (Signed)
06/21/2023 8:42 AM   Christopher Daugherty 11-12-1955 [id number]  Referring provider: Kandyce Rud, MD [phone] [address] [facility] [place],  [place] [address]  Urological history: 1. rUTI's -Contribute factors of age, BPH, obesity, prediabetic,   2. OAB -Contribute factors of age, BPH, obesity, prediabetic, allergies, hypertension, alcohol consumption and sleep apnea -solifenacin 10 mg daily   3. ED -Contribute factors of age, BPH, obesity, prediabetic, hypertension, sleep apnea and alcohol consumption -Sildenafil 100 mg on demand dosing  4.  Hypogonadism -Contributing factors of age, obesity, prediabetes and sleep apnea -not on TRT   No chief complaint on file.   HPI: Christopher Daugherty is a 67 y.o. male  who presents today for painful urination.   Previous records reviewed.   At his visit on 05/23/2023, he was seen by Dr. Richardo Hanks on March 22, 2023 for recurrent UTIs and OAB.   He was placed on 3 weeks of Bactrim DS and had a CT urogram for further evaluation.  CT urogram was negative.   He has since had another UTI with E. coli for which he was placed on Omnicef for 7 days on October 4th.  He then was instructed to follow-up with [place] again.   He states his symptoms abate while he is on the [place], but then they return 2 to 3 days after he completes the antibiotic.   Over the weekend he started to experience the frequency, urgency and dysuria.  Patient denies any modifying or aggravating factors.  Patient denies any recent UTI's, gross hematuria or suprapubic/flank pain.  Patient denies any fevers, chills, nausea or vomiting.    He started AZO which helped the discomfort.  He also had been taking cranberry tablets since he received his CT scan results in August.  He stated he was doing some research and noted that some people are placed on a low-dose antibiotic for awhile and he is wondering if he could have a low-dose antibiotic for a while as  well.  UA pyuria.  Urine culture positive for E.Coli.    He underwent further evaluation with cystoscopy with Dr. Richardo Hanks on June 07, 2023 and it was normal.  At that appointment he was extended on his current course of antibiotics for total of 5 weeks and then to be followed for trimethoprim prophylaxis for 60 days.    PMH: Past Medical History:  Diagnosis Date   Allergy    Atrial fibrillation (HCC)    BPH (benign prostatic hyperplasia)    Hematuria    Hypertension    Morbid obesity (HCC)    Sleep apnea     Surgical History: Past Surgical History:  Procedure Laterality Date   CARDIOVERSION N/A 03/01/2023   Procedure: CARDIOVERSION;  Surgeon: Clotilde Dieter, DO;  Location: [facility] ORS;  Service: Cardiovascular;  Laterality: N/A;   COLONOSCOPY WITH PROPOFOL N/A 05/13/2022   Procedure: COLONOSCOPY WITH PROPOFOL;  Surgeon: Regis Bill, MD;  Location: [facility] ENDOSCOPY;  Service: Endoscopy;  Laterality: N/A;   TEE WITHOUT CARDIOVERSION N/A 03/01/2023   Procedure: TRANSESOPHAGEAL ECHOCARDIOGRAM (TEE);  Surgeon: Clotilde Dieter, DO;  Location: [facility] ORS;  Service: Cardiovascular;  Laterality: N/A;    Home Medications:  Allergies as of 06/21/2023   No Known Allergies      Medication List        Accurate as of June 20, 2023  8:42 AM. If you have any questions, ask your nurse or doctor.  amiodarone 200 MG tablet Commonly known as: PACERONE TAKE 1 TABLET BY MOUTH 2 TIMES A DAY FOR 2 WEEKS, THEN TAKE 1 TABLET BY MOUTH DAILY   aspirin EC 81 MG tablet Take by mouth.   Biotin 5000 MCG Caps Take 5,000 capsules by mouth daily.   Cats Claw 1000 MG Caps Take by mouth daily.   cefdinir 300 MG capsule Commonly known as: OMNICEF Take 1 capsule (300 mg total) by mouth 2 (two) times daily.   losartan 25 MG tablet Commonly known as: COZAAR Take 1 tablet by mouth daily.   MACA PO Take 1,000 mg by mouth daily.   magnesium 84 MG ( ) Tbcr SR  tablet Commonly known as: MAGTAB Take 84 mg by mouth daily.   MAGNESIUM PO Take by mouth daily.   metoprolol tartrate 25 MG tablet Commonly known as: LOPRESSOR Take 1 tablet (25 mg total) by mouth 2 (two) times daily.   Na Sulfate-K Sulfate-Mg Sulf 17.5-3.13-1.6 GM/177ML Soln Take by mouth.   OVER THE COUNTER MEDICATION L-citrulline daily   Potassium 99 MG Tabs Take 1 tablet by mouth daily.   sildenafil 100 MG tablet Commonly known as: VIAGRA Take 100 mg by mouth as needed for erectile dysfunction.   solifenacin 10 MG tablet Commonly known as: VESICARE Take 10 mg by mouth daily.   trimethoprim 100 MG tablet Commonly known as: TRIMPEX Take 1 tablet (100 mg total) by mouth daily.        Allergies: No Known Allergies  Family History: Family History  Problem Relation Age of Onset   Hypertension Mother    Heart attack Mother    Diabetes Mother     Social History:  reports that he has never smoked. He has never been exposed to tobacco smoke. He has never used smokeless tobacco. He reports current alcohol use of about 4.0 standard drinks of alcohol per week. He reports that he does not use drugs.  ROS: Pertinent ROS in HPI  Physical Exam: There were no vitals taken for this visit.  Constitutional:  Well nourished. Alert and oriented, No acute distress. HEENT: [place] AT, moist mucus membranes.  Trachea midline, no masses. Cardiovascular: No clubbing, cyanosis, or edema. Respiratory: Normal respiratory effort, no increased work of breathing. GI: Abdomen is soft, non tender, non distended, no abdominal masses. Liver and spleen not palpable.  No hernias appreciated.  Stool sample for occult testing is not indicated.   GU: No CVA tenderness.  No bladder fullness or masses.  Patient with circumcised/uncircumcised phallus. ***Foreskin easily retracted***  Urethral meatus is patent.  No penile discharge. No penile lesions or rashes. Scrotum without lesions, cysts, rashes and/or  edema.  Testicles are located scrotally bilaterally. No masses are appreciated in the testicles. Left and right epididymis are normal. Rectal: Patient with  normal sphincter tone. Anus and perineum without scarring or rashes. No rectal masses are appreciated. Prostate is approximately *** grams, *** nodules are appreciated. Seminal vesicles are normal. Skin: No rashes, bruises or suspicious lesions. Lymph: No cervical or inguinal adenopathy. Neurologic: Grossly intact, no focal deficits, moving all 4 extremities. Psychiatric: Normal mood and affect.   Laboratory Data: Urinalysis See EPIC and HPI I have reviewed the labs.  Pertinent Imaging: N/A  Assessment & Plan:    1. Dysuria/rUTI's -UA *** -Urine cultures repeated, atypical sent in GC and chlamydia sent -He will continue the Omnicef until culture results are available -Reassured the patient that his CT and cystoscopy did not have any worrisome findings and  that if this is prostatitis, it will take several weeks of antibiotics to experience relief -Reviewed return to clinic precautions  2. OAB -continue Vesicare   No follow-ups on file.  These notes generated with voice recognition software. I apologize for typographical errors.  Cloretta Ned  [facility] [address]  [address] [place], [place] [address] [phone]

## 2023-06-21 ENCOUNTER — Ambulatory Visit (INDEPENDENT_AMBULATORY_CARE_PROVIDER_SITE_OTHER): Payer: Medicare Other | Admitting: Urology

## 2023-06-21 ENCOUNTER — Other Ambulatory Visit: Payer: Self-pay

## 2023-06-21 ENCOUNTER — Encounter: Payer: Self-pay | Admitting: Urology

## 2023-06-21 VITALS — BP 126/72 | HR 72

## 2023-06-21 DIAGNOSIS — N390 Urinary tract infection, site not specified: Secondary | ICD-10-CM

## 2023-06-21 DIAGNOSIS — R300 Dysuria: Secondary | ICD-10-CM

## 2023-06-21 DIAGNOSIS — Z87440 Personal history of urinary (tract) infections: Secondary | ICD-10-CM

## 2023-06-21 DIAGNOSIS — N3281 Overactive bladder: Secondary | ICD-10-CM

## 2023-06-21 LAB — MICROSCOPIC EXAMINATION
Bacteria, UA: NONE SEEN
RBC, Urine: NONE SEEN /[HPF] (ref 0–2)

## 2023-06-21 LAB — URINALYSIS, COMPLETE
Bilirubin, UA: NEGATIVE
Glucose, UA: NEGATIVE
Ketones, UA: NEGATIVE
Leukocytes,UA: NEGATIVE
Nitrite, UA: NEGATIVE
Protein,UA: NEGATIVE
RBC, UA: NEGATIVE
Specific Gravity, UA: 1.015 (ref 1.005–1.030)
Urobilinogen, Ur: 0.2 mg/dL (ref 0.2–1.0)
pH, UA: 6 (ref 5.0–7.5)

## 2023-06-21 MED ORDER — URIBEL 81.6 MG PO TABS
1.0000 | ORAL_TABLET | Freq: Four times a day (QID) | ORAL | 0 refills | Status: DC | PRN
Start: 2023-06-21 — End: 2023-09-19

## 2023-06-21 NOTE — Patient Instructions (Signed)
Claritin 10 mg daily for dysuria

## 2023-06-24 LAB — CULTURE, URINE COMPREHENSIVE

## 2023-06-24 LAB — GC/CHLAMYDIA PROBE AMP
Chlamydia trachomatis, NAA: NEGATIVE
Neisseria Gonorrhoeae by PCR: NEGATIVE

## 2023-06-25 ENCOUNTER — Other Ambulatory Visit: Payer: Self-pay | Admitting: Urology

## 2023-06-25 MED ORDER — AMOXICILLIN-POT CLAVULANATE 875-125 MG PO TABS: 1.0000 | ORAL_TABLET | Freq: Two times a day (BID) | ORAL | 0 refills | Status: DC

## 2023-07-03 LAB — MYCOPLASMA / UREAPLASMA CULTURE
Mycoplasma hominis Culture: NEGATIVE
Ureaplasma urealyticum: NEGATIVE

## 2023-07-05 ENCOUNTER — Ambulatory Visit: Payer: Medicare Other | Admitting: Urology

## 2023-07-10 ENCOUNTER — Other Ambulatory Visit: Payer: Self-pay | Admitting: Urology

## 2023-07-12 ENCOUNTER — Other Ambulatory Visit: Payer: Self-pay | Admitting: Urology

## 2023-07-12 ENCOUNTER — Telehealth: Payer: Self-pay | Admitting: Urology

## 2023-07-12 DIAGNOSIS — N481 Balanitis: Secondary | ICD-10-CM

## 2023-07-12 MED ORDER — NYSTATIN-TRIAMCINOLONE 100000-0.1 UNIT/GM-% EX OINT
1.0000 | TOPICAL_OINTMENT | Freq: Two times a day (BID) | CUTANEOUS | 0 refills | Status: AC
Start: 2023-07-12 — End: ?

## 2023-07-12 NOTE — Telephone Encounter (Signed)
Spoke with patient and he states the rash started 3 days ago and he still has 2 weeks left. Red, itchy from the foreskin to the tip of the penis

## 2023-07-12 NOTE — Telephone Encounter (Signed)
Pt sent MyChart message    I believe I have developed a yeast infection after being on the new antibiotics for 2 weeks.  Can DR or NP prescribe something.  Itchy burning rash on foreskin and head of penis

## 2023-07-13 ENCOUNTER — Other Ambulatory Visit: Payer: Medicare Other | Admitting: Urology

## 2023-08-10 ENCOUNTER — Encounter: Payer: Self-pay | Admitting: *Deleted

## 2023-08-21 ENCOUNTER — Encounter
Admission: RE | Disposition: A | Discharge status: Home / Self Care | Payer: Self-pay | Source: Ambulatory Visit | Attending: Gastroenterology

## 2023-08-21 ENCOUNTER — Ambulatory Visit: Payer: Medicare Other | Admitting: General Practice

## 2023-08-21 ENCOUNTER — Ambulatory Visit
Admission: RE | Admit: 2023-08-21 | Discharge: 2023-08-21 | Disposition: A | Discharge status: Home / Self Care | Payer: Medicare Other | Source: Ambulatory Visit | Attending: Gastroenterology | Admitting: Gastroenterology

## 2023-08-21 ENCOUNTER — Encounter: Payer: Self-pay | Admitting: *Deleted

## 2023-08-21 DIAGNOSIS — K5730 Diverticulosis of large intestine without perforation or abscess without bleeding: Secondary | ICD-10-CM | POA: Insufficient documentation

## 2023-08-21 DIAGNOSIS — I10 Essential (primary) hypertension: Secondary | ICD-10-CM | POA: Insufficient documentation

## 2023-08-21 DIAGNOSIS — D120 Benign neoplasm of cecum: Secondary | ICD-10-CM | POA: Insufficient documentation

## 2023-08-21 DIAGNOSIS — G4733 Obstructive sleep apnea (adult) (pediatric): Secondary | ICD-10-CM | POA: Insufficient documentation

## 2023-08-21 DIAGNOSIS — I4891 Unspecified atrial fibrillation: Secondary | ICD-10-CM | POA: Diagnosis not present

## 2023-08-21 DIAGNOSIS — K640 First degree hemorrhoids: Secondary | ICD-10-CM | POA: Diagnosis not present

## 2023-08-21 DIAGNOSIS — Z6841 Body Mass Index (BMI) 40.0 and over, adult: Secondary | ICD-10-CM | POA: Insufficient documentation

## 2023-08-21 DIAGNOSIS — Z09 Encounter for follow-up examination after completed treatment for conditions other than malignant neoplasm: Secondary | ICD-10-CM | POA: Diagnosis present

## 2023-08-21 HISTORY — DX: Urgency of urination: R39.15

## 2023-08-21 HISTORY — PX: POLYPECTOMY: SHX5525

## 2023-08-21 HISTORY — DX: Allergic rhinitis, unspecified: J30.9

## 2023-08-21 HISTORY — PX: COLONOSCOPY WITH PROPOFOL: SHX5780

## 2023-08-21 HISTORY — DX: Other specified abnormal findings of blood chemistry: R79.89

## 2023-08-21 HISTORY — DX: Overactive bladder: N32.81

## 2023-08-21 SURGERY — COLONOSCOPY WITH PROPOFOL
Anesthesia: General

## 2023-08-21 MED ORDER — PROPOFOL 1000 MG/100ML IV EMUL
INTRAVENOUS | Status: AC
  Filled 2023-08-21: qty 100

## 2023-08-21 MED ORDER — PROPOFOL 500 MG/50ML IV EMUL
INTRAVENOUS | Status: DC | PRN
  Administered 2023-08-21: 75 ug/kg/min via INTRAVENOUS

## 2023-08-21 MED ORDER — LIDOCAINE HCL (PF) 2 % IJ SOLN
INTRAMUSCULAR | Status: AC
  Filled 2023-08-21: qty 5

## 2023-08-21 MED ORDER — LIDOCAINE HCL (CARDIAC) PF 100 MG/5ML IV SOSY
PREFILLED_SYRINGE | INTRAVENOUS | Status: DC | PRN
  Administered 2023-08-21: 100 mg via INTRAVENOUS

## 2023-08-21 MED ORDER — DEXMEDETOMIDINE HCL IN NACL 80 MCG/20ML IV SOLN
INTRAVENOUS | Status: AC
  Filled 2023-08-21: qty 20

## 2023-08-21 MED ORDER — DEXMEDETOMIDINE HCL IN NACL 80 MCG/20ML IV SOLN
INTRAVENOUS | Status: DC | PRN
  Administered 2023-08-21: 12 ug via INTRAVENOUS
  Administered 2023-08-21: 8 ug via INTRAVENOUS

## 2023-08-21 MED ORDER — SODIUM CHLORIDE 0.9 % IV SOLN
INTRAVENOUS | Status: DC
  Administered 2023-08-21: 500 mL via INTRAVENOUS

## 2023-08-21 MED ORDER — PROPOFOL 10 MG/ML IV BOLUS
INTRAVENOUS | Status: DC | PRN
  Administered 2023-08-21: 20 mg via INTRAVENOUS
  Administered 2023-08-21: 30 mg via INTRAVENOUS
  Administered 2023-08-21: 50 mg via INTRAVENOUS

## 2023-08-21 NOTE — Anesthesia Preprocedure Evaluation (Signed)
Anesthesia Evaluation  Patient identified by MRN, date of birth, ID band Patient awake    Reviewed: Allergy & Precautions, NPO status , Patient's Chart, lab work & pertinent test results  History of Anesthesia Complications Negative for: history of anesthetic complications  Airway Mallampati: III  TM Distance: >3 FB Neck ROM: Full    Dental  (+) Partial Upper   Pulmonary sleep apnea and Continuous Positive Airway Pressure Ventilation , neg COPD, Patient abstained from smoking.Not current smoker   Pulmonary exam normal breath sounds clear to auscultation       Cardiovascular Exercise Tolerance: Good hypertension, (-) CAD and (-) Past MI + dysrhythmias Atrial Fibrillation + Valvular Problems/Murmurs MR and AI  Rhythm:Irregular Rate:Normal - Systolic murmurs INTERPRETATION  NORMAL LEFT VENTRICULAR SYSTOLIC FUNCTION WITH AN ESTIMATED EF = 50-55 %  MILD RV SYSTOLIC DYSFUNCTION (See above)  MODERATE TRICUSPID AND MITRAL VALVE INSUFFICIENCY  MILD AORTIC VALVE INSUFFICIENCY  NO VALVULAR STENOSIS  MODERATE RV ENLARGEMENT  MODERATE BIATRIAL ENLARGEMENT     Neuro/Psych negative neurological ROS  negative psych ROS   GI/Hepatic ,neg GERD  ,,(+)     (-) substance abuse    Endo/Other  neg diabetes  Class 3 obesity  Renal/GU      Musculoskeletal   Abdominal   Peds  Hematology   Anesthesia Other Findings Past Medical History: No date: Allergy No date: Atrial fibrillation (HCC) No date: BPH (benign prostatic hyperplasia) No date: Hematuria No date: Hypertension No date: Morbid obesity (HCC) No date: Sleep apnea  Reproductive/Obstetrics                             Anesthesia Physical Anesthesia Plan  ASA: 3  Anesthesia Plan: General   Post-op Pain Management: Minimal or no pain anticipated   Induction: Intravenous  PONV Risk Score and Plan: 3 and Propofol infusion, TIVA and  Ondansetron  Airway Management Planned: Nasal Cannula  Additional Equipment: None  Intra-op Plan:   Post-operative Plan:   Informed Consent: I have reviewed the patients History and Physical, chart, labs and discussed the procedure including the risks, benefits and alternatives for the proposed anesthesia with the patient or authorized representative who has indicated his/her understanding and acceptance.     Dental advisory given  Plan Discussed with: CRNA and Surgeon  Anesthesia Plan Comments: (Discussed risks of anesthesia with patient, including possibility of difficulty with spontaneous ventilation under anesthesia necessitating airway intervention, PONV, and rare risks such as cardiac or respiratory or neurological events, and allergic reactions. Discussed the role of CRNA in patient's perioperative care. Patient understands.)       Anesthesia Quick Evaluation

## 2023-08-21 NOTE — Op Note (Signed)
[facility] Patient Name: Christopher Daugherty Procedure Date: 08/21/2023 10:15 AM MRN: [id number] Account #: [id number] Date of Birth: 1955-08-06 Admit Type: Outpatient Age: 68 Room: [facility] ENDO ROOM 3 Gender: Male Note Status: Finalized Instrument Name: Prentice Docker [id number] Procedure:             Colonoscopy Indications:           Surveillance: Piecemeal removal of large sessile                         adenoma last colonoscopy (< 3 yrs) Providers:             Eather Colas MD, MD Referring MD:          Hassell Halim MD (Referring MD) Medicines:             Monitored Anesthesia Care Complications:         No immediate complications. Estimated blood loss:                         Minimal. Procedure:             Pre-Anesthesia Assessment:                        - Prior to the procedure, a History and Physical was                         performed, and patient medications and allergies were                         reviewed. The patient is competent. The risks and                         benefits of the procedure and the sedation options and                         risks were discussed with the patient. All questions                         were answered and informed consent was obtained.                         Patient identification and proposed procedure were                         verified by the physician, the nurse, the                         anesthesiologist, the anesthetist and the technician                         in the endoscopy suite. Mental Status Examination:                         alert and oriented. Airway Examination: normal                         oropharyngeal airway and neck mobility. Respiratory  Examination: clear to auscultation. CV Examination:                         normal. Prophylactic Antibiotics: The patient does not                         require prophylactic antibiotics. Prior                          Anticoagulants: The patient has taken no anticoagulant                         or antiplatelet agents. ASA Grade Assessment: III - A                         patient with severe systemic disease. After reviewing                         the risks and benefits, the patient was deemed in                         satisfactory condition to undergo the procedure. The                         anesthesia plan was to use monitored anesthesia care                         (MAC). Immediately prior to administration of                         medications, the patient was re-assessed for adequacy                         to receive sedatives. The heart rate, respiratory                         rate, oxygen saturations, blood pressure, adequacy of                         pulmonary ventilation, and response to care were                         monitored throughout the procedure. The physical                         status of the patient was re-assessed after the                         procedure.                        After obtaining informed consent, the colonoscope was                         passed under direct vision. Throughout the procedure,                         the patient's blood pressure, pulse, and oxygen  saturations were monitored continuously. The                         Colonoscope was introduced through the anus and                         advanced to the the cecum, identified by appendiceal                         orifice and ileocecal valve. The colonoscopy was                         somewhat difficult due to significant looping.                         Successful completion of the procedure was aided by                         changing the patient to a supine position. The patient                         tolerated the procedure well. The quality of the bowel                         preparation was adequate to identify polyps. The                          ileocecal valve, appendiceal orifice, and rectum were                         photographed. Findings:      The perianal and digital rectal examinations were normal.      Two sessile polyps were found in the cecum. The polyps were 1 to 2 mm in       size. These appeared to be at the site of the previous polypectomy.       These polyps were removed with a cold snare. Resection and retrieval       were complete. Estimated blood loss was minimal.      Multiple large-mouthed and small-mouthed diverticula were found in the       sigmoid colon, descending colon, transverse colon and hepatic flexure.      Internal hemorrhoids were found during retroflexion. The hemorrhoids       were Grade I (internal hemorrhoids that do not prolapse).      The exam was otherwise without abnormality on direct and retroflexion       views. Impression:            - Two 1 to 2 mm polyps in the cecum, removed with a                         cold snare. Resected and retrieved.                        - Diverticulosis in the sigmoid colon, in the                         descending colon, in the transverse colon and at the  hepatic flexure.                        - Internal hemorrhoids.                        - The examination was otherwise normal on direct and                         retroflexion views. Recommendation:        - Discharge patient to home.                        - Resume previous diet.                        - Continue present medications.                        - Await pathology results.                        - Repeat colonoscopy for surveillance based on                         pathology results.                        - Return to referring physician as previously                         scheduled. Procedure Code(s):     --- Professional ---                        [id number], Colonoscopy, flexible; with removal of                         tumor(s), polyp(s), or other lesion(s) by  snare                         technique Diagnosis Code(s):     --- Professional ---                        Z86.010, Personal history of colonic polyps                        D12.0, Benign neoplasm of cecum                        K64.0, First degree hemorrhoids                        K57.30, Diverticulosis of large intestine without                         perforation or abscess without bleeding CPT copyright 2022 American Medical Association. All rights reserved. The codes documented in this report are preliminary and upon coder review may  be revised to meet current compliance requirements. Eather Colas MD, MD 08/21/2023 11:00:56 AM Number of Addenda: 0 Note Initiated On: 08/21/2023 10:15 AM Scope Withdrawal Time: 0 hours 7 minutes 49 seconds  Total Procedure Duration: 0 hours 17  minutes 11 seconds  Estimated Blood Loss:  Estimated blood loss was minimal.      [facility]

## 2023-08-21 NOTE — Anesthesia Postprocedure Evaluation (Signed)
Anesthesia Post Note  Patient: Christopher Daugherty  Procedure(s) Performed: COLONOSCOPY WITH PROPOFOL POLYPECTOMY  Patient location during evaluation: Endoscopy Anesthesia Type: General Level of consciousness: awake and alert Pain management: pain level controlled Vital Signs Assessment: post-procedure vital signs reviewed and stable Respiratory status: spontaneous breathing, nonlabored ventilation, respiratory function stable and patient connected to nasal cannula oxygen Cardiovascular status: blood pressure returned to baseline and stable Postop Assessment: no apparent nausea or vomiting Anesthetic complications: no  No notable events documented.   Last Vitals:  Vitals:   08/21/23 1109 08/21/23 1119  BP: 114/70 128/71  Pulse: 63 (!) 59  Resp: 15 17  Temp:    SpO2: 96% 97%    Last Pain:  Vitals:   08/21/23 1119  TempSrc:   PainSc: 0-No pain                 Stephanie Coup

## 2023-08-21 NOTE — Interval H&P Note (Signed)
History and Physical Interval Note:  08/21/2023 10:25 AM  Christopher Daugherty  has presented today for surgery, with the diagnosis of h/o TA polyps.  The various methods of treatment have been discussed with the patient and family. After consideration of risks, benefits and other options for treatment, the patient has consented to  Procedure(s): COLONOSCOPY WITH PROPOFOL (N/A) as a surgical intervention.  The patient's history has been reviewed, patient examined, no change in status, stable for surgery.  I have reviewed the patient's chart and labs.  Questions were answered to the patient's satisfaction.     Regis Bill  Ok to proceed with colonoscopy

## 2023-08-21 NOTE — H&P (Signed)
Outpatient short stay form Pre-procedure 08/21/2023  Regis Bill, MD  Primary Physician: Kandyce Rud, MD  Reason for visit:  Surveillance  History of present illness:    68 y/o gentleman with history of hypertension, OSA, obesity, and a. Fib here for colonoscopy for history of piecemeal resection of cecal polyp in 2023. No blood thinners currently, no family history of GI malignancies, no significant abdominal surgeries.    Current Facility-Administered Medications:    0.9 %  sodium chloride infusion, , Intravenous, Continuous, Brentyn Seehafer, Rossie Muskrat, MD, Last Rate: 20 mL/hr at 08/21/23 1020, 500 mL at 08/21/23 1020  Medications Prior to Admission  Medication Sig Dispense Refill Last Dose/Taking   amiodarone (PACERONE) 200 MG tablet TAKE 1 TABLET BY MOUTH 2 TIMES A DAY FOR 2 WEEKS, THEN TAKE 1 TABLET BY MOUTH DAILY   Past Week   aspirin EC 81 MG tablet Take by mouth.   Past Month   Biotin 5000 MCG CAPS Take 5,000 capsules by mouth daily.   Past Week   Cats Claw 1000 MG CAPS Take by mouth daily.   Past Week   losartan (COZAAR) 25 MG tablet Take 1 tablet by mouth daily.   08/20/2023 Morning   [place] (MACA PO) Take 1,000 mg by mouth daily.   Past Week   magnesium (MAGTAB) 84 MG ( ) TBCR SR tablet Take 84 mg by mouth daily.   Past Week   MAGNESIUM PO Take by mouth daily.   Past Week   metoprolol tartrate (LOPRESSOR) 25 MG tablet Take 1 tablet (25 mg total) by mouth 2 (two) times daily. 60 tablet 2 08/20/2023 Morning   Na Sulfate-K Sulfate-Mg Sulf 17.5-3.13-1.6 GM/177ML SOLN Take by mouth.   08/20/2023   nystatin-triamcinolone ointment (MYCOLOG) Apply 1 Application topically 2 (two) times daily. 30 g 0 Past Week   OVER THE COUNTER MEDICATION L-citrulline daily   Past Week   Potassium 99 MG TABS Take 1 tablet by mouth daily.   Past Week   solifenacin (VESICARE) 10 MG tablet Take 10 mg by mouth daily.   08/20/2023 Morning   amoxicillin-clavulanate (AUGMENTIN) 875-125 MG tablet Take  1 tablet by mouth every 12 (twelve) hours. (Patient not taking: Reported on 08/21/2023) 60 tablet 0 Completed Course   Meth-Hyo-M Bl-Benz Acd-Ph Sal (URIBEL) 81.6 MG TABS Take 1 tablet (81.6 mg total) by mouth every 6 (six) hours as needed. 30 tablet 0    sildenafil (VIAGRA) 100 MG tablet Take 100 mg by mouth as needed for erectile dysfunction.        No Known Allergies   Past Medical History:  Diagnosis Date   Allergic rhinitis    Allergy    Atrial fibrillation (HCC)    BPH (benign prostatic hyperplasia)    Hematuria    Hypertension    Low testosterone    Morbid obesity (HCC)    OAB (overactive bladder)    Sleep apnea    Urgency of urination     Review of systems:  Otherwise negative.    Physical Exam  Gen: Alert, oriented. Appears stated age.  HEENT: PERRLA. Lungs: No respiratory distress CV: RRR Abd: soft, benign, no masses Ext: No edema    Planned procedures: Proceed with colonoscopy. The patient understands the nature of the planned procedure, indications, risks, alternatives and potential complications including but not limited to bleeding, infection, perforation, damage to internal organs and possible oversedation/side effects from anesthesia. The patient agrees and gives consent to proceed.  Please refer to procedure  notes for findings, recommendations and patient disposition/instructions.     Regis Bill, MD [facility]

## 2023-08-21 NOTE — Transfer of Care (Signed)
Immediate Anesthesia Transfer of Care Note  Patient: Christopher Daugherty  Procedure(s) Performed: COLONOSCOPY WITH PROPOFOL POLYPECTOMY  Patient Location: PACU  Anesthesia Type:General  Level of Consciousness: sedated  Airway & Oxygen Therapy: Patient Spontanous Breathing  Post-op Assessment: Report given to RN and Post -op Vital signs reviewed and stable  Post vital signs: Reviewed and stable  Last Vitals:  Vitals Value Taken Time  BP 122/72 08/21/23 1059  Temp 36 C 08/21/23 1059  Pulse 60 08/21/23 1100  Resp 10 08/21/23 1100  SpO2 99 % 08/21/23 1100  Vitals shown include unfiled device data.  Last Pain:  Vitals:   08/21/23 1059  TempSrc: Temporal  PainSc: 0-No pain         Complications: No notable events documented.

## 2023-08-22 ENCOUNTER — Encounter: Payer: Self-pay | Admitting: Gastroenterology

## 2023-08-22 LAB — SURGICAL PATHOLOGY

## 2023-09-15 ENCOUNTER — Other Ambulatory Visit: Payer: Self-pay

## 2023-09-15 DIAGNOSIS — N390 Urinary tract infection, site not specified: Secondary | ICD-10-CM

## 2023-09-15 DIAGNOSIS — N3281 Overactive bladder: Secondary | ICD-10-CM

## 2023-09-19 ENCOUNTER — Ambulatory Visit (INDEPENDENT_AMBULATORY_CARE_PROVIDER_SITE_OTHER): Payer: Medicare Other | Admitting: Urology

## 2023-09-19 ENCOUNTER — Encounter: Payer: Self-pay | Admitting: Urology

## 2023-09-19 VITALS — BP 153/72 | HR 99 | Ht 68.0 in | Wt 300.0 lb

## 2023-09-19 DIAGNOSIS — Z87440 Personal history of urinary (tract) infections: Secondary | ICD-10-CM | POA: Diagnosis not present

## 2023-09-19 DIAGNOSIS — N390 Urinary tract infection, site not specified: Secondary | ICD-10-CM

## 2023-09-19 DIAGNOSIS — N3281 Overactive bladder: Secondary | ICD-10-CM | POA: Diagnosis not present

## 2023-09-19 LAB — BLADDER SCAN AMB NON-IMAGING: Scan Result: 0

## 2023-09-19 NOTE — Progress Notes (Signed)
   09/19/2023 10:10 AM   Christopher Daugherty [birth year] [id number]  Reason for visit: Follow up recurrent UTI/prostatitis, OAB  HPI: 68 year old male with morbid obesity and BMI of 46 who I saw originally in August 2024 when he was having recurrent E. coli infections.  In hindsight these were likely more of a prostatitis that were incompletely treated with short courses of antibiotics, and ultimately he was treated with a month-long course of Augmentin with resolution of his symptoms.  He denies any urinary complaints today.  He had further workup for his infections with a normal CT and cystoscopy, prostate was small.  PVRs have always been normal including 0ml today.  Encouraged to continue cranberry tablet prophylaxis.  He has a history of overactive bladder and was started on Vesicare by Dr. Mena Goes, this continues to be filled by PCP with good results.  Has a history of ED previously on sildenafil, has not had to use that medication recently.  Return precautions discussed, if recurrent infections recommend 3 to 4-week course for prostatitis. RTC 1 year PVR, if doing well at that time can follow-up as needed  Sondra Come, MD  [facility] [address], [address] [place], [place] [address] [phone]

## 2024-02-29 ENCOUNTER — Encounter: Payer: Self-pay | Admitting: Urology

## 2024-04-17 ENCOUNTER — Other Ambulatory Visit: Payer: Self-pay | Admitting: Internal Medicine

## 2024-04-17 DIAGNOSIS — I4891 Unspecified atrial fibrillation: Secondary | ICD-10-CM

## 2024-05-01 ENCOUNTER — Inpatient Hospital Stay: Admission: RE | Admit: 2024-05-01 | Source: Ambulatory Visit

## 2024-05-02 ENCOUNTER — Other Ambulatory Visit: Payer: Self-pay | Admitting: Internal Medicine

## 2024-05-02 DIAGNOSIS — I4891 Unspecified atrial fibrillation: Secondary | ICD-10-CM

## 2024-05-02 DIAGNOSIS — R0789 Other chest pain: Secondary | ICD-10-CM

## 2024-05-15 ENCOUNTER — Encounter (HOSPITAL_COMMUNITY): Payer: Self-pay

## 2024-05-17 ENCOUNTER — Telehealth (HOSPITAL_COMMUNITY): Payer: Self-pay | Admitting: Emergency Medicine

## 2024-05-17 NOTE — Telephone Encounter (Signed)
 Reaching out to patient to offer assistance regarding upcoming cardiac imaging study; pt verbalizes understanding of appt date/time, parking situation and where to check in, pre-test NPO status and medications ordered, and verified current allergies; name and call back number provided for further questions should they arise Rockwell Alexandria RN Navigator Cardiac Imaging [facility] [phone] office [phone] cell

## 2024-05-20 ENCOUNTER — Ambulatory Visit
Admission: RE | Admit: 2024-05-20 | Discharge: 2024-05-20 | Disposition: A | Discharge status: Home / Self Care | Source: Ambulatory Visit | Attending: Internal Medicine | Admitting: Internal Medicine

## 2024-05-20 DIAGNOSIS — I4891 Unspecified atrial fibrillation: Secondary | ICD-10-CM | POA: Insufficient documentation

## 2024-05-20 DIAGNOSIS — R0789 Other chest pain: Secondary | ICD-10-CM | POA: Insufficient documentation

## 2024-05-20 MED ORDER — NITROGLYCERIN 0.4 MG SL SUBL
0.8000 mg | SUBLINGUAL_TABLET | Freq: Once | SUBLINGUAL | Status: AC
  Administered 2024-05-20: 0.8 mg via SUBLINGUAL

## 2024-05-20 MED ORDER — DILTIAZEM HCL 25 MG/5ML IV SOLN: 10.0000 mg | INTRAVENOUS | Status: DC | PRN

## 2024-05-20 MED ORDER — IOHEXOL 350 MG/ML SOLN
100.0000 mL | Freq: Once | INTRAVENOUS | Status: AC | PRN
  Administered 2024-05-20: 120 mL via INTRAVENOUS

## 2024-05-20 MED ORDER — METOPROLOL TARTRATE 5 MG/5ML IV SOLN: 10.0000 mg | Freq: Once | INTRAVENOUS | Status: DC | PRN

## 2024-09-17 ENCOUNTER — Ambulatory Visit: Admitting: Urology

## 2024-09-18 ENCOUNTER — Ambulatory Visit: Payer: Medicare Other | Admitting: Urology
